# Patient Record
Sex: Female | Born: 1975 | Race: White | Hispanic: No | Marital: Married | Smoking: Never smoker
Health system: Southern US, Community
[De-identification: ages and names within clinical notes are randomized; demographics above are authoritative.]

---

## 1997-12-19 ENCOUNTER — Ambulatory Visit (HOSPITAL_COMMUNITY): Admission: RE | Admit: 1997-12-19 | Discharge: 1997-12-19 | Payer: Self-pay | Admitting: Neurological Surgery

## 2004-06-29 ENCOUNTER — Other Ambulatory Visit: Admission: RE | Admit: 2004-06-29 | Discharge: 2004-06-29 | Payer: Self-pay | Admitting: Obstetrics and Gynecology

## 2005-04-03 ENCOUNTER — Ambulatory Visit: Payer: Self-pay | Admitting: Pulmonary Disease

## 2005-04-03 ENCOUNTER — Inpatient Hospital Stay (HOSPITAL_COMMUNITY): Admission: RE | Admit: 2005-04-03 | Discharge: 2005-04-07 | Payer: Self-pay | Admitting: Neurological Surgery

## 2005-10-26 ENCOUNTER — Other Ambulatory Visit: Admission: RE | Admit: 2005-10-26 | Discharge: 2005-10-26 | Payer: Self-pay | Admitting: Obstetrics and Gynecology

## 2015-01-19 ENCOUNTER — Other Ambulatory Visit: Payer: Self-pay | Admitting: Obstetrics & Gynecology

## 2015-01-19 DIAGNOSIS — R928 Other abnormal and inconclusive findings on diagnostic imaging of breast: Secondary | ICD-10-CM

## 2015-01-21 ENCOUNTER — Other Ambulatory Visit: Payer: Self-pay | Admitting: Obstetrics & Gynecology

## 2015-01-21 DIAGNOSIS — R928 Other abnormal and inconclusive findings on diagnostic imaging of breast: Secondary | ICD-10-CM

## 2015-01-28 ENCOUNTER — Ambulatory Visit
Admission: RE | Admit: 2015-01-28 | Discharge: 2015-01-28 | Disposition: A | Discharge status: Home / Self Care | Payer: 59 | Source: Ambulatory Visit | Attending: Obstetrics & Gynecology | Admitting: Obstetrics & Gynecology

## 2015-01-28 DIAGNOSIS — R928 Other abnormal and inconclusive findings on diagnostic imaging of breast: Secondary | ICD-10-CM

## 2016-07-13 ENCOUNTER — Ambulatory Visit (INDEPENDENT_AMBULATORY_CARE_PROVIDER_SITE_OTHER): Payer: 59 | Admitting: Psychology

## 2016-07-13 DIAGNOSIS — F4323 Adjustment disorder with mixed anxiety and depressed mood: Secondary | ICD-10-CM

## 2016-08-09 ENCOUNTER — Ambulatory Visit: Payer: 59 | Admitting: Psychology

## 2016-08-11 DIAGNOSIS — Z01419 Encounter for gynecological examination (general) (routine) without abnormal findings: Secondary | ICD-10-CM | POA: Diagnosis not present

## 2016-08-11 DIAGNOSIS — Z1231 Encounter for screening mammogram for malignant neoplasm of breast: Secondary | ICD-10-CM | POA: Diagnosis not present

## 2016-08-11 DIAGNOSIS — Z124 Encounter for screening for malignant neoplasm of cervix: Secondary | ICD-10-CM | POA: Diagnosis not present

## 2016-08-21 DIAGNOSIS — S60940A Unspecified superficial injury of right index finger, initial encounter: Secondary | ICD-10-CM | POA: Diagnosis not present

## 2016-08-30 ENCOUNTER — Ambulatory Visit: Payer: 59 | Admitting: Psychology

## 2016-09-14 ENCOUNTER — Ambulatory Visit (INDEPENDENT_AMBULATORY_CARE_PROVIDER_SITE_OTHER): Payer: 59 | Admitting: Psychology

## 2016-09-14 DIAGNOSIS — F4323 Adjustment disorder with mixed anxiety and depressed mood: Secondary | ICD-10-CM

## 2016-10-05 ENCOUNTER — Ambulatory Visit (INDEPENDENT_AMBULATORY_CARE_PROVIDER_SITE_OTHER): Payer: 59 | Admitting: Psychology

## 2016-10-05 DIAGNOSIS — F4323 Adjustment disorder with mixed anxiety and depressed mood: Secondary | ICD-10-CM

## 2016-10-05 DIAGNOSIS — Z0000 Encounter for general adult medical examination without abnormal findings: Secondary | ICD-10-CM | POA: Diagnosis not present

## 2016-10-11 DIAGNOSIS — J3089 Other allergic rhinitis: Secondary | ICD-10-CM | POA: Diagnosis not present

## 2016-10-11 DIAGNOSIS — G43909 Migraine, unspecified, not intractable, without status migrainosus: Secondary | ICD-10-CM | POA: Diagnosis not present

## 2016-10-11 DIAGNOSIS — Z23 Encounter for immunization: Secondary | ICD-10-CM | POA: Diagnosis not present

## 2016-10-11 DIAGNOSIS — Z0000 Encounter for general adult medical examination without abnormal findings: Secondary | ICD-10-CM | POA: Diagnosis not present

## 2016-10-11 DIAGNOSIS — Z1389 Encounter for screening for other disorder: Secondary | ICD-10-CM | POA: Diagnosis not present

## 2016-11-01 ENCOUNTER — Ambulatory Visit (INDEPENDENT_AMBULATORY_CARE_PROVIDER_SITE_OTHER): Payer: 59 | Admitting: Psychology

## 2016-11-01 DIAGNOSIS — F4323 Adjustment disorder with mixed anxiety and depressed mood: Secondary | ICD-10-CM | POA: Diagnosis not present

## 2016-11-30 ENCOUNTER — Ambulatory Visit: Payer: 59 | Admitting: Psychology

## 2017-02-16 ENCOUNTER — Ambulatory Visit (INDEPENDENT_AMBULATORY_CARE_PROVIDER_SITE_OTHER): Payer: 59 | Admitting: Psychology

## 2017-02-16 DIAGNOSIS — F4323 Adjustment disorder with mixed anxiety and depressed mood: Secondary | ICD-10-CM

## 2017-02-22 DIAGNOSIS — Z23 Encounter for immunization: Secondary | ICD-10-CM | POA: Diagnosis not present

## 2017-04-01 DIAGNOSIS — M65331 Trigger finger, right middle finger: Secondary | ICD-10-CM | POA: Diagnosis not present

## 2017-04-03 DIAGNOSIS — M79644 Pain in right finger(s): Secondary | ICD-10-CM | POA: Diagnosis not present

## 2017-04-05 ENCOUNTER — Ambulatory Visit (INDEPENDENT_AMBULATORY_CARE_PROVIDER_SITE_OTHER): Payer: 59 | Admitting: Psychology

## 2017-04-05 DIAGNOSIS — F320 Major depressive disorder, single episode, mild: Secondary | ICD-10-CM

## 2017-05-17 ENCOUNTER — Ambulatory Visit: Payer: 59 | Admitting: Psychology

## 2017-05-25 ENCOUNTER — Ambulatory Visit (INDEPENDENT_AMBULATORY_CARE_PROVIDER_SITE_OTHER): Payer: 59 | Admitting: Psychology

## 2017-05-25 DIAGNOSIS — F322 Major depressive disorder, single episode, severe without psychotic features: Secondary | ICD-10-CM

## 2017-06-05 DIAGNOSIS — M5416 Radiculopathy, lumbar region: Secondary | ICD-10-CM | POA: Diagnosis not present

## 2017-06-07 ENCOUNTER — Other Ambulatory Visit: Payer: Self-pay | Admitting: Internal Medicine

## 2017-06-07 DIAGNOSIS — M5416 Radiculopathy, lumbar region: Secondary | ICD-10-CM

## 2017-06-09 ENCOUNTER — Ambulatory Visit
Admission: RE | Admit: 2017-06-09 | Discharge: 2017-06-09 | Disposition: A | Discharge status: Home / Self Care | Payer: 59 | Source: Ambulatory Visit | Attending: Internal Medicine | Admitting: Internal Medicine

## 2017-06-09 DIAGNOSIS — M5416 Radiculopathy, lumbar region: Secondary | ICD-10-CM

## 2017-06-09 DIAGNOSIS — M48061 Spinal stenosis, lumbar region without neurogenic claudication: Secondary | ICD-10-CM | POA: Diagnosis not present

## 2017-06-18 DIAGNOSIS — M79644 Pain in right finger(s): Secondary | ICD-10-CM | POA: Diagnosis not present

## 2017-06-21 DIAGNOSIS — M5416 Radiculopathy, lumbar region: Secondary | ICD-10-CM | POA: Diagnosis not present

## 2017-06-21 DIAGNOSIS — M5126 Other intervertebral disc displacement, lumbar region: Secondary | ICD-10-CM | POA: Diagnosis not present

## 2017-06-29 DIAGNOSIS — M5136 Other intervertebral disc degeneration, lumbar region: Secondary | ICD-10-CM | POA: Diagnosis not present

## 2017-06-29 DIAGNOSIS — M5116 Intervertebral disc disorders with radiculopathy, lumbar region: Secondary | ICD-10-CM | POA: Diagnosis not present

## 2017-06-29 DIAGNOSIS — M4726 Other spondylosis with radiculopathy, lumbar region: Secondary | ICD-10-CM | POA: Diagnosis not present

## 2017-06-29 DIAGNOSIS — M48061 Spinal stenosis, lumbar region without neurogenic claudication: Secondary | ICD-10-CM | POA: Diagnosis not present

## 2017-07-09 DIAGNOSIS — Z79899 Other long term (current) drug therapy: Secondary | ICD-10-CM | POA: Diagnosis not present

## 2017-07-09 DIAGNOSIS — M65331 Trigger finger, right middle finger: Secondary | ICD-10-CM | POA: Diagnosis not present

## 2017-07-16 ENCOUNTER — Ambulatory Visit (INDEPENDENT_AMBULATORY_CARE_PROVIDER_SITE_OTHER): Payer: 59 | Admitting: Psychology

## 2017-07-16 DIAGNOSIS — M79644 Pain in right finger(s): Secondary | ICD-10-CM | POA: Diagnosis not present

## 2017-07-16 DIAGNOSIS — F322 Major depressive disorder, single episode, severe without psychotic features: Secondary | ICD-10-CM | POA: Diagnosis not present

## 2017-07-16 DIAGNOSIS — M25641 Stiffness of right hand, not elsewhere classified: Secondary | ICD-10-CM | POA: Diagnosis not present

## 2017-07-16 DIAGNOSIS — R600 Localized edema: Secondary | ICD-10-CM | POA: Diagnosis not present

## 2017-07-23 DIAGNOSIS — G478 Other sleep disorders: Secondary | ICD-10-CM | POA: Diagnosis not present

## 2017-08-08 DIAGNOSIS — M79644 Pain in right finger(s): Secondary | ICD-10-CM | POA: Diagnosis not present

## 2017-08-08 DIAGNOSIS — R29898 Other symptoms and signs involving the musculoskeletal system: Secondary | ICD-10-CM | POA: Diagnosis not present

## 2017-08-08 DIAGNOSIS — R600 Localized edema: Secondary | ICD-10-CM | POA: Diagnosis not present

## 2017-08-22 DIAGNOSIS — R29898 Other symptoms and signs involving the musculoskeletal system: Secondary | ICD-10-CM | POA: Diagnosis not present

## 2017-08-22 DIAGNOSIS — M25641 Stiffness of right hand, not elsewhere classified: Secondary | ICD-10-CM | POA: Diagnosis not present

## 2017-08-22 DIAGNOSIS — M79644 Pain in right finger(s): Secondary | ICD-10-CM | POA: Diagnosis not present

## 2017-08-23 ENCOUNTER — Ambulatory Visit (INDEPENDENT_AMBULATORY_CARE_PROVIDER_SITE_OTHER): Payer: 59 | Admitting: Psychology

## 2017-08-23 DIAGNOSIS — F322 Major depressive disorder, single episode, severe without psychotic features: Secondary | ICD-10-CM | POA: Diagnosis not present

## 2017-09-03 DIAGNOSIS — M79644 Pain in right finger(s): Secondary | ICD-10-CM | POA: Diagnosis not present

## 2017-09-03 DIAGNOSIS — M25641 Stiffness of right hand, not elsewhere classified: Secondary | ICD-10-CM | POA: Diagnosis not present

## 2017-09-03 DIAGNOSIS — R29898 Other symptoms and signs involving the musculoskeletal system: Secondary | ICD-10-CM | POA: Diagnosis not present

## 2017-10-11 ENCOUNTER — Ambulatory Visit (INDEPENDENT_AMBULATORY_CARE_PROVIDER_SITE_OTHER): Payer: 59 | Admitting: Psychology

## 2017-10-11 DIAGNOSIS — F322 Major depressive disorder, single episode, severe without psychotic features: Secondary | ICD-10-CM | POA: Diagnosis not present

## 2017-10-23 DIAGNOSIS — G4700 Insomnia, unspecified: Secondary | ICD-10-CM | POA: Diagnosis not present

## 2017-11-14 ENCOUNTER — Ambulatory Visit (INDEPENDENT_AMBULATORY_CARE_PROVIDER_SITE_OTHER): Payer: 59 | Admitting: Psychology

## 2017-11-14 DIAGNOSIS — F322 Major depressive disorder, single episode, severe without psychotic features: Secondary | ICD-10-CM | POA: Diagnosis not present

## 2017-11-19 DIAGNOSIS — G478 Other sleep disorders: Secondary | ICD-10-CM | POA: Diagnosis not present

## 2018-01-03 ENCOUNTER — Ambulatory Visit (INDEPENDENT_AMBULATORY_CARE_PROVIDER_SITE_OTHER): Payer: 59 | Admitting: Psychology

## 2018-01-03 DIAGNOSIS — F322 Major depressive disorder, single episode, severe without psychotic features: Secondary | ICD-10-CM | POA: Diagnosis not present

## 2018-01-24 DIAGNOSIS — G4700 Insomnia, unspecified: Secondary | ICD-10-CM | POA: Diagnosis not present

## 2018-02-01 ENCOUNTER — Ambulatory Visit (INDEPENDENT_AMBULATORY_CARE_PROVIDER_SITE_OTHER): Payer: 59 | Admitting: Psychology

## 2018-02-01 DIAGNOSIS — F322 Major depressive disorder, single episode, severe without psychotic features: Secondary | ICD-10-CM | POA: Diagnosis not present

## 2018-02-22 DIAGNOSIS — Z23 Encounter for immunization: Secondary | ICD-10-CM | POA: Diagnosis not present

## 2018-03-29 ENCOUNTER — Ambulatory Visit (INDEPENDENT_AMBULATORY_CARE_PROVIDER_SITE_OTHER): Payer: 59 | Admitting: Psychology

## 2018-03-29 DIAGNOSIS — F322 Major depressive disorder, single episode, severe without psychotic features: Secondary | ICD-10-CM | POA: Diagnosis not present

## 2018-04-01 DIAGNOSIS — Z0000 Encounter for general adult medical examination without abnormal findings: Secondary | ICD-10-CM | POA: Diagnosis not present

## 2018-04-01 DIAGNOSIS — R82998 Other abnormal findings in urine: Secondary | ICD-10-CM | POA: Diagnosis not present

## 2018-04-02 DIAGNOSIS — G4700 Insomnia, unspecified: Secondary | ICD-10-CM | POA: Diagnosis not present

## 2018-04-08 DIAGNOSIS — Z0000 Encounter for general adult medical examination without abnormal findings: Secondary | ICD-10-CM | POA: Diagnosis not present

## 2018-04-08 DIAGNOSIS — Z1389 Encounter for screening for other disorder: Secondary | ICD-10-CM | POA: Diagnosis not present

## 2018-04-08 DIAGNOSIS — M5416 Radiculopathy, lumbar region: Secondary | ICD-10-CM | POA: Diagnosis not present

## 2018-04-08 DIAGNOSIS — G43909 Migraine, unspecified, not intractable, without status migrainosus: Secondary | ICD-10-CM | POA: Diagnosis not present

## 2018-04-15 DIAGNOSIS — B349 Viral infection, unspecified: Secondary | ICD-10-CM | POA: Diagnosis not present

## 2018-04-15 DIAGNOSIS — J029 Acute pharyngitis, unspecified: Secondary | ICD-10-CM | POA: Diagnosis not present

## 2018-05-24 ENCOUNTER — Ambulatory Visit: Payer: Self-pay | Admitting: Psychology

## 2018-06-28 ENCOUNTER — Ambulatory Visit (INDEPENDENT_AMBULATORY_CARE_PROVIDER_SITE_OTHER): Payer: BLUE CROSS/BLUE SHIELD | Admitting: Psychology

## 2018-06-28 DIAGNOSIS — F322 Major depressive disorder, single episode, severe without psychotic features: Secondary | ICD-10-CM

## 2018-08-06 ENCOUNTER — Ambulatory Visit: Payer: BLUE CROSS/BLUE SHIELD | Admitting: Psychology

## 2018-09-17 IMAGING — MR MR LUMBAR SPINE W/O CM
5 series · 47 of 48 positions shown · non-contrast
Comparison: None.

CLINICAL DATA: Low back pain radiating to left leg

EXAM:
MRI LUMBAR SPINE WITHOUT CONTRAST
TECHNIQUE: Multiplanar, multisequence MR imaging of the lumbar spine was
performed. No intravenous contrast was administered.

[Series 3: T1 · sagittal · 4.0mm · 0.88mm/px · 6 of 15 slices shown (1 of 2)]
[im 1/15]
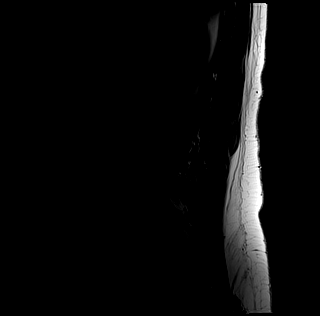
[im 3/15]
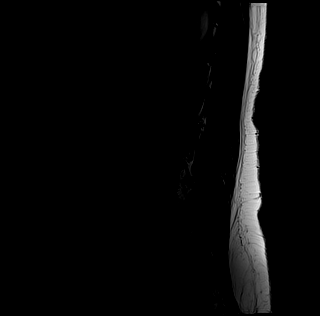
[im 6/15]
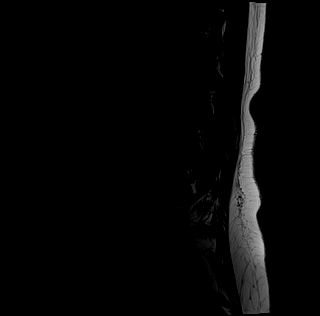
[im 9/15]
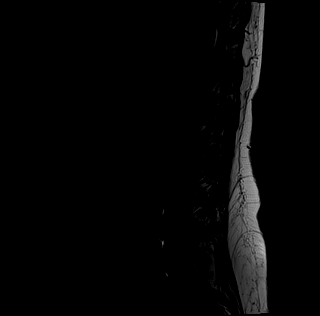
[im 12/15]
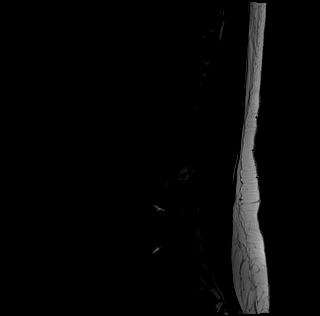
[im 15/15]
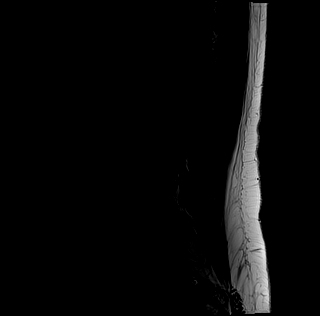

[Series 4: T2 · sagittal · 4.0mm · 0.88mm/px · 5 of 15 slices shown (1 of 2)]
[im 1/15]
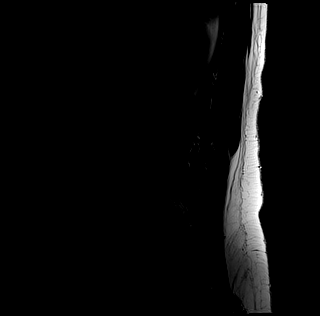
[im 4/15]
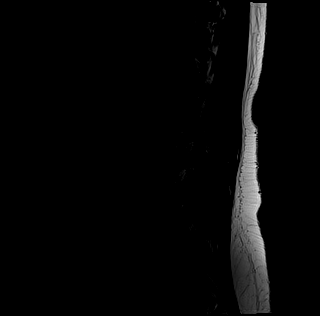
[im 8/15]
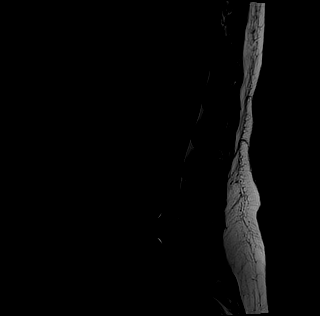
[im 11/15]
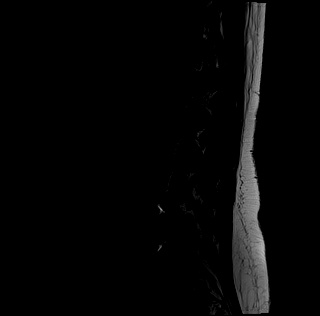
[im 15/15]
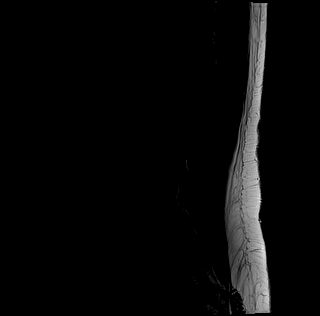

[Series 5: tirm sag · sagittal · 4.0mm · 0.55mm/px · 5 of 15 slices shown]
[im 1/15]
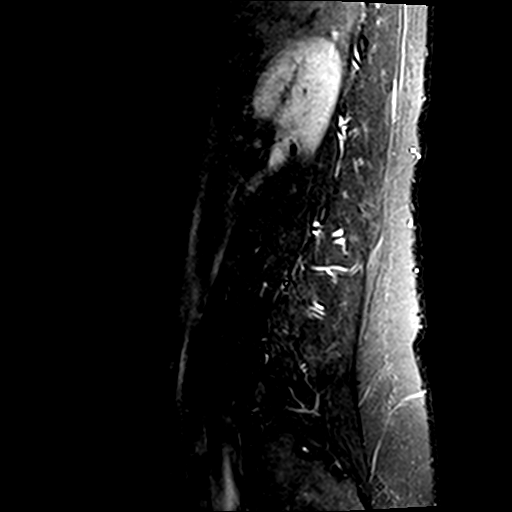
[im 4/15]
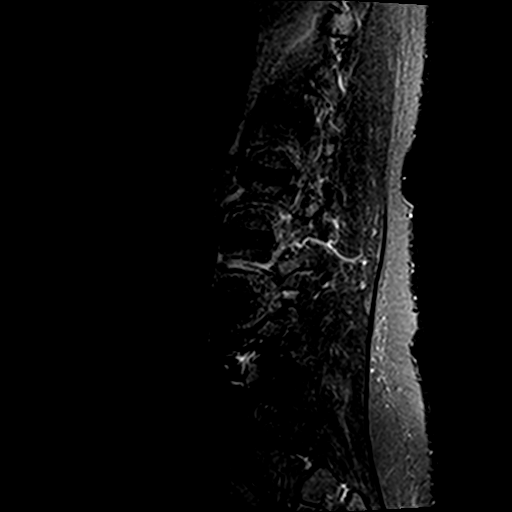
[im 8/15]
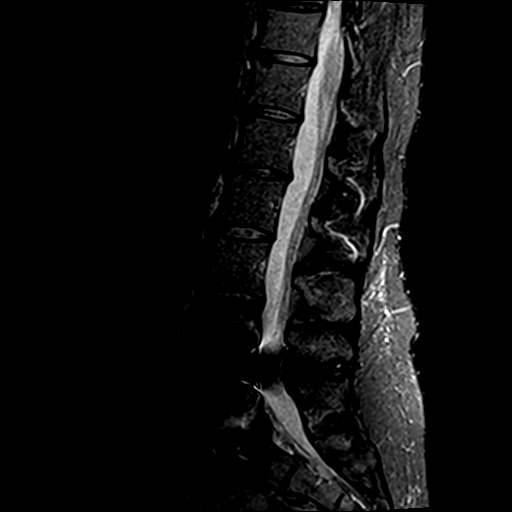
[im 11/15]
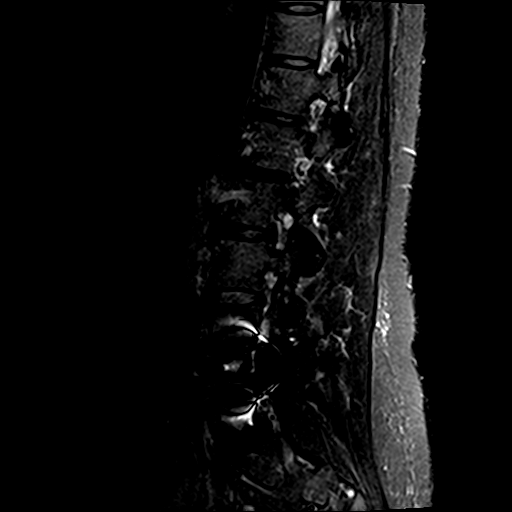
[im 15/15]
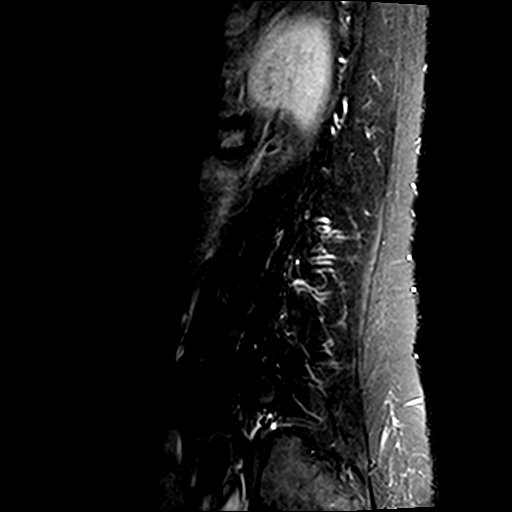

[Series 6: T1 · axial · 4.0mm · 0.70mm/px · z∈[-111,+101]mm · 15 of 43 slices shown (2 of 2)]
[im 1/43]
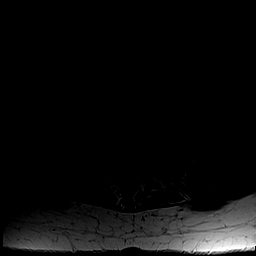
[im 3/43]
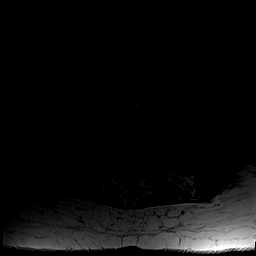
[im 6/43]
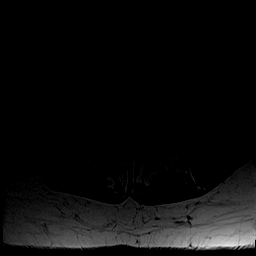
[im 9/43]
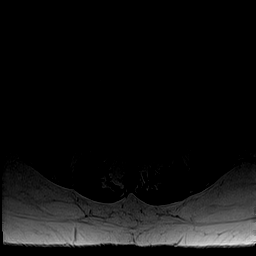
[im 12/43]
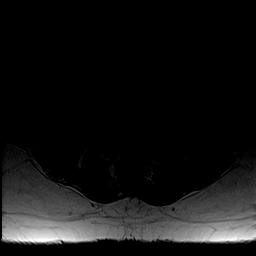
[im 15/43]
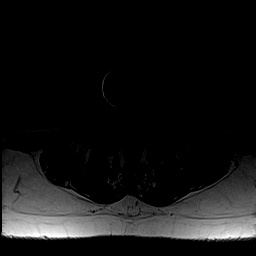
[im 17/43]
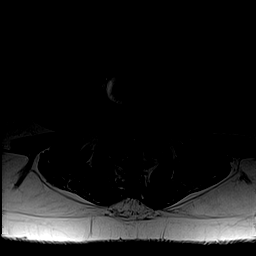
[im 20/43]
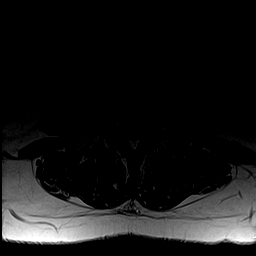
[im 23/43]
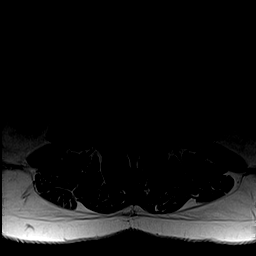
[im 26/43]
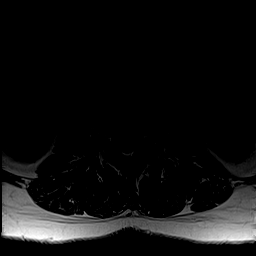
[im 29/43]
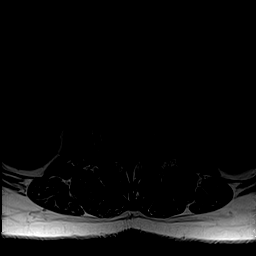
[im 31/43]
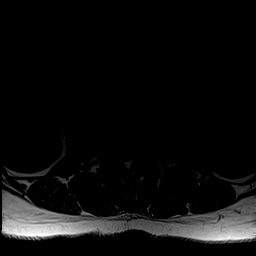
[im 34/43]
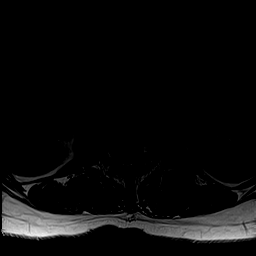
[im 37/43]
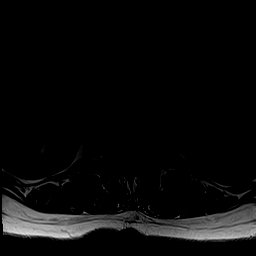
[im 43/43]
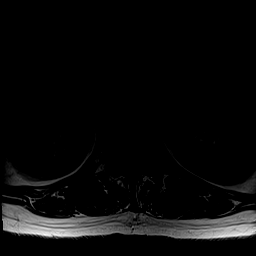

[Series 7: T2 · axial · 4.0mm · 0.70mm/px · z∈[-111,+101]mm · 16 of 43 slices shown (2 of 2)]
[im 1/43]
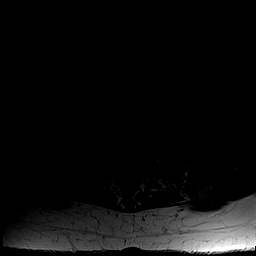
[im 3/43]
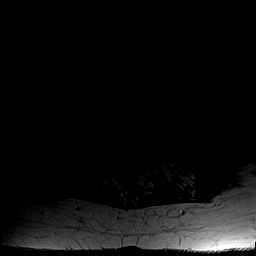
[im 6/43]
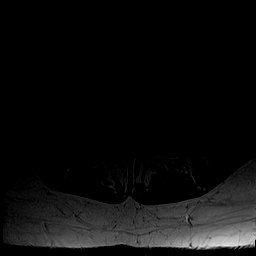
[im 9/43]
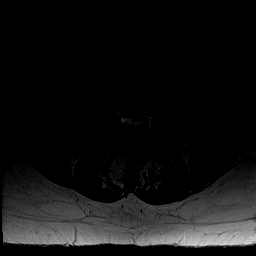
[im 12/43]
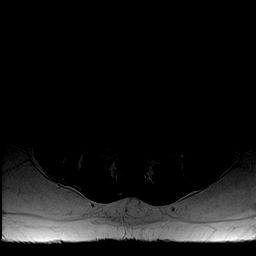
[im 15/43]
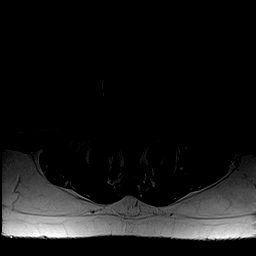
[im 17/43]
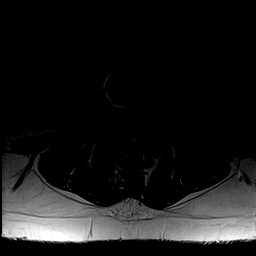
[im 20/43]
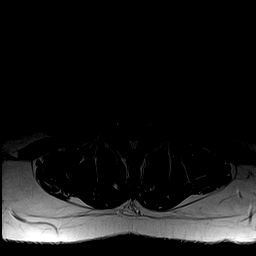
[im 23/43]
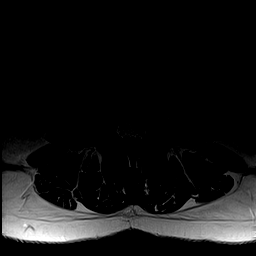
[im 26/43]
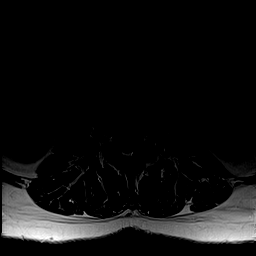
[im 29/43]
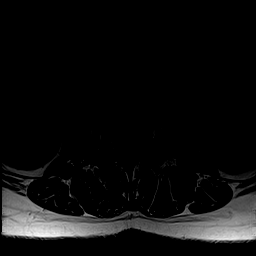
[im 31/43]
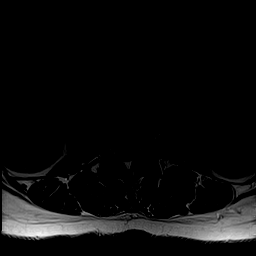
[im 34/43]
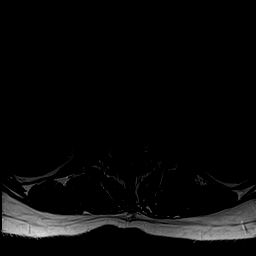
[im 37/43]
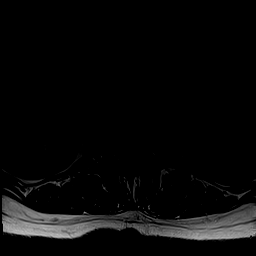
[im 40/43]
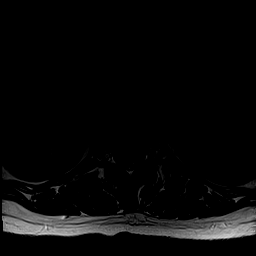
[im 43/43]
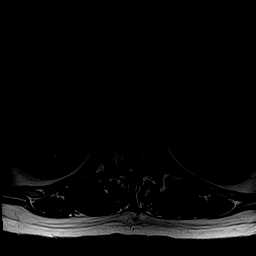

[47 of 48 positions shown; findings below may reference images not displayed]

FINDINGS: Segmentation:  Standard.

Alignment:  Physiologic.

Vertebrae: Artificial disc at L4-L5. L5-S1 fusion. No acute marrow
abnormality.

Conus medullaris and cauda equina: Conus extends to the T12-L1
level. Conus and cauda equina appear normal.

Paraspinal and other soft tissues: Negative

Disc levels:

Except as described below the intervertebral disc spaces are normal.

L2-L3: Left foraminal disc protrusion with annular fissure in close
proximity to the exiting left L2 nerve root. No spinal canal
stenosis.

L4-L5: Artificial disc. Moderate right and severe left facet
hypertrophy. No spinal canal stenosis or nerve root impingement.

L5-S1: Postfusion changes. Left subarticular disc osteophyte complex
mildly narrowing the left lateral recess.
IMPRESSION: 1. L2-L3 left foraminal disc protrusion and annular fissure, which
may be a source of irritation to the left L2 nerve root.
2. Postsurgical changes at L4-L5 and L5-S1. Mild narrowing of the
left lateral recess at L5-S1 secondary to left subarticular disc
osteophyte complex. Correlate for left S1 radiculopathy.

## 2019-01-01 DIAGNOSIS — F411 Generalized anxiety disorder: Secondary | ICD-10-CM | POA: Diagnosis not present

## 2019-01-01 DIAGNOSIS — F331 Major depressive disorder, recurrent, moderate: Secondary | ICD-10-CM | POA: Diagnosis not present

## 2019-02-18 DIAGNOSIS — Z01419 Encounter for gynecological examination (general) (routine) without abnormal findings: Secondary | ICD-10-CM | POA: Diagnosis not present

## 2019-02-18 DIAGNOSIS — Z1231 Encounter for screening mammogram for malignant neoplasm of breast: Secondary | ICD-10-CM | POA: Diagnosis not present

## 2019-02-18 DIAGNOSIS — Z124 Encounter for screening for malignant neoplasm of cervix: Secondary | ICD-10-CM | POA: Diagnosis not present

## 2019-04-07 DIAGNOSIS — Z0000 Encounter for general adult medical examination without abnormal findings: Secondary | ICD-10-CM | POA: Diagnosis not present

## 2019-04-07 DIAGNOSIS — R7989 Other specified abnormal findings of blood chemistry: Secondary | ICD-10-CM | POA: Diagnosis not present

## 2019-04-09 DIAGNOSIS — F331 Major depressive disorder, recurrent, moderate: Secondary | ICD-10-CM | POA: Diagnosis not present

## 2019-04-09 DIAGNOSIS — R82998 Other abnormal findings in urine: Secondary | ICD-10-CM | POA: Diagnosis not present

## 2019-04-14 DIAGNOSIS — K219 Gastro-esophageal reflux disease without esophagitis: Secondary | ICD-10-CM | POA: Insufficient documentation

## 2019-04-14 DIAGNOSIS — J309 Allergic rhinitis, unspecified: Secondary | ICD-10-CM | POA: Diagnosis not present

## 2019-04-14 DIAGNOSIS — G43909 Migraine, unspecified, not intractable, without status migrainosus: Secondary | ICD-10-CM | POA: Diagnosis not present

## 2019-04-14 DIAGNOSIS — M5416 Radiculopathy, lumbar region: Secondary | ICD-10-CM | POA: Diagnosis not present

## 2019-04-14 DIAGNOSIS — Z1331 Encounter for screening for depression: Secondary | ICD-10-CM | POA: Diagnosis not present

## 2019-04-14 DIAGNOSIS — Z0000 Encounter for general adult medical examination without abnormal findings: Secondary | ICD-10-CM | POA: Diagnosis not present

## 2019-05-13 DIAGNOSIS — K921 Melena: Secondary | ICD-10-CM | POA: Diagnosis not present

## 2019-06-03 DIAGNOSIS — M79644 Pain in right finger(s): Secondary | ICD-10-CM | POA: Diagnosis not present

## 2019-07-09 DIAGNOSIS — F331 Major depressive disorder, recurrent, moderate: Secondary | ICD-10-CM | POA: Diagnosis not present

## 2019-07-09 DIAGNOSIS — F411 Generalized anxiety disorder: Secondary | ICD-10-CM | POA: Diagnosis not present

## 2019-07-29 DIAGNOSIS — Z1212 Encounter for screening for malignant neoplasm of rectum: Secondary | ICD-10-CM | POA: Diagnosis not present

## 2019-07-29 DIAGNOSIS — M79644 Pain in right finger(s): Secondary | ICD-10-CM | POA: Diagnosis not present

## 2019-07-31 ENCOUNTER — Ambulatory Visit: Payer: BC Managed Care – PPO | Attending: Internal Medicine

## 2019-07-31 DIAGNOSIS — M79644 Pain in right finger(s): Secondary | ICD-10-CM | POA: Diagnosis not present

## 2019-07-31 DIAGNOSIS — Z23 Encounter for immunization: Secondary | ICD-10-CM

## 2019-07-31 DIAGNOSIS — M1811 Unilateral primary osteoarthritis of first carpometacarpal joint, right hand: Secondary | ICD-10-CM | POA: Diagnosis not present

## 2019-07-31 DIAGNOSIS — M654 Radial styloid tenosynovitis [de Quervain]: Secondary | ICD-10-CM | POA: Diagnosis not present

## 2019-07-31 NOTE — Progress Notes (Signed)
   Covid-19 Vaccination Clinic  Name:  RENELDA KILIAN    MRN: [id number] DOB: 1976/01/14  07/31/2019  [name] was observed post Covid-19 immunization for 15 minutes without incident. She was provided with Vaccine Information Sheet and instruction to access the V-Safe system.   [name] was instructed to call 911 with any severe reactions post vaccine: Marland Kitchen Difficulty breathing  . Swelling of face and throat  . A fast heartbeat  . A bad rash all over body  . Dizziness and weakness   Immunizations Administered    Name Date Dose VIS Date Route   Pfizer COVID-19 Vaccine 07/31/2019  3:58 PM 0.3 mL 05/02/2019 Intramuscular   Manufacturer: [employer], [employer]   Lot: [id number]   NDC: [address]-1

## 2019-09-02 DIAGNOSIS — L308 Other specified dermatitis: Secondary | ICD-10-CM | POA: Diagnosis not present

## 2019-09-02 DIAGNOSIS — L738 Other specified follicular disorders: Secondary | ICD-10-CM | POA: Diagnosis not present

## 2019-09-02 DIAGNOSIS — D225 Melanocytic nevi of trunk: Secondary | ICD-10-CM | POA: Diagnosis not present

## 2019-09-02 DIAGNOSIS — L564 Polymorphous light eruption: Secondary | ICD-10-CM | POA: Diagnosis not present

## 2019-09-30 DIAGNOSIS — M79644 Pain in right finger(s): Secondary | ICD-10-CM | POA: Diagnosis not present

## 2019-11-05 DIAGNOSIS — F331 Major depressive disorder, recurrent, moderate: Secondary | ICD-10-CM | POA: Diagnosis not present

## 2019-11-05 DIAGNOSIS — F411 Generalized anxiety disorder: Secondary | ICD-10-CM | POA: Diagnosis not present

## 2019-11-13 DIAGNOSIS — M79644 Pain in right finger(s): Secondary | ICD-10-CM | POA: Diagnosis not present

## 2019-11-13 DIAGNOSIS — M1811 Unilateral primary osteoarthritis of first carpometacarpal joint, right hand: Secondary | ICD-10-CM | POA: Diagnosis not present

## 2019-11-28 ENCOUNTER — Ambulatory Visit (INDEPENDENT_AMBULATORY_CARE_PROVIDER_SITE_OTHER): Payer: BC Managed Care – PPO | Admitting: Psychology

## 2019-11-28 DIAGNOSIS — F320 Major depressive disorder, single episode, mild: Secondary | ICD-10-CM

## 2019-12-25 ENCOUNTER — Ambulatory Visit (INDEPENDENT_AMBULATORY_CARE_PROVIDER_SITE_OTHER): Payer: BC Managed Care – PPO | Admitting: Psychology

## 2019-12-25 DIAGNOSIS — F320 Major depressive disorder, single episode, mild: Secondary | ICD-10-CM | POA: Diagnosis not present

## 2019-12-29 ENCOUNTER — Ambulatory Visit: Payer: BC Managed Care – PPO | Admitting: Psychology

## 2020-01-20 DIAGNOSIS — M1811 Unilateral primary osteoarthritis of first carpometacarpal joint, right hand: Secondary | ICD-10-CM | POA: Diagnosis not present

## 2020-02-06 DIAGNOSIS — Z9889 Other specified postprocedural states: Secondary | ICD-10-CM | POA: Diagnosis not present

## 2020-02-06 DIAGNOSIS — M1811 Unilateral primary osteoarthritis of first carpometacarpal joint, right hand: Secondary | ICD-10-CM | POA: Diagnosis not present

## 2020-02-06 DIAGNOSIS — M19031 Primary osteoarthritis, right wrist: Secondary | ICD-10-CM | POA: Diagnosis not present

## 2020-02-10 DIAGNOSIS — F331 Major depressive disorder, recurrent, moderate: Secondary | ICD-10-CM | POA: Diagnosis not present

## 2020-02-10 DIAGNOSIS — F411 Generalized anxiety disorder: Secondary | ICD-10-CM | POA: Diagnosis not present

## 2020-02-12 DIAGNOSIS — M79644 Pain in right finger(s): Secondary | ICD-10-CM | POA: Diagnosis not present

## 2020-02-12 DIAGNOSIS — M1811 Unilateral primary osteoarthritis of first carpometacarpal joint, right hand: Secondary | ICD-10-CM | POA: Diagnosis not present

## 2020-02-19 DIAGNOSIS — M79644 Pain in right finger(s): Secondary | ICD-10-CM | POA: Diagnosis not present

## 2020-03-04 DIAGNOSIS — M79644 Pain in right finger(s): Secondary | ICD-10-CM | POA: Diagnosis not present

## 2020-03-18 DIAGNOSIS — M79644 Pain in right finger(s): Secondary | ICD-10-CM | POA: Diagnosis not present

## 2020-04-01 DIAGNOSIS — M79644 Pain in right finger(s): Secondary | ICD-10-CM | POA: Diagnosis not present

## 2020-04-13 DIAGNOSIS — M79644 Pain in right finger(s): Secondary | ICD-10-CM | POA: Diagnosis not present

## 2020-04-20 DIAGNOSIS — L301 Dyshidrosis [pompholyx]: Secondary | ICD-10-CM | POA: Diagnosis not present

## 2020-05-04 DIAGNOSIS — F411 Generalized anxiety disorder: Secondary | ICD-10-CM | POA: Diagnosis not present

## 2020-05-04 DIAGNOSIS — F331 Major depressive disorder, recurrent, moderate: Secondary | ICD-10-CM | POA: Diagnosis not present

## 2020-05-13 DIAGNOSIS — G8918 Other acute postprocedural pain: Secondary | ICD-10-CM | POA: Diagnosis not present

## 2020-05-13 DIAGNOSIS — M75121 Complete rotator cuff tear or rupture of right shoulder, not specified as traumatic: Secondary | ICD-10-CM | POA: Diagnosis not present

## 2020-05-13 DIAGNOSIS — S43431A Superior glenoid labrum lesion of right shoulder, initial encounter: Secondary | ICD-10-CM | POA: Diagnosis not present

## 2020-05-13 DIAGNOSIS — M7541 Impingement syndrome of right shoulder: Secondary | ICD-10-CM | POA: Diagnosis not present

## 2020-05-13 DIAGNOSIS — S46011A Strain of muscle(s) and tendon(s) of the rotator cuff of right shoulder, initial encounter: Secondary | ICD-10-CM | POA: Diagnosis not present

## 2020-05-13 DIAGNOSIS — M25511 Pain in right shoulder: Secondary | ICD-10-CM | POA: Diagnosis not present

## 2020-06-21 ENCOUNTER — Ambulatory Visit (INDEPENDENT_AMBULATORY_CARE_PROVIDER_SITE_OTHER): Payer: BC Managed Care – PPO | Admitting: Psychology

## 2020-06-21 DIAGNOSIS — F320 Major depressive disorder, single episode, mild: Secondary | ICD-10-CM

## 2020-06-24 DIAGNOSIS — Z0000 Encounter for general adult medical examination without abnormal findings: Secondary | ICD-10-CM | POA: Diagnosis not present

## 2020-06-30 DIAGNOSIS — Z0000 Encounter for general adult medical examination without abnormal findings: Secondary | ICD-10-CM | POA: Diagnosis not present

## 2020-06-30 DIAGNOSIS — Z1331 Encounter for screening for depression: Secondary | ICD-10-CM | POA: Diagnosis not present

## 2020-06-30 DIAGNOSIS — Z1339 Encounter for screening examination for other mental health and behavioral disorders: Secondary | ICD-10-CM | POA: Diagnosis not present

## 2020-06-30 DIAGNOSIS — Z1212 Encounter for screening for malignant neoplasm of rectum: Secondary | ICD-10-CM | POA: Diagnosis not present

## 2020-08-03 DIAGNOSIS — F331 Major depressive disorder, recurrent, moderate: Secondary | ICD-10-CM | POA: Diagnosis not present

## 2020-08-03 DIAGNOSIS — F411 Generalized anxiety disorder: Secondary | ICD-10-CM | POA: Diagnosis not present

## 2020-09-14 DIAGNOSIS — Z124 Encounter for screening for malignant neoplasm of cervix: Secondary | ICD-10-CM | POA: Diagnosis not present

## 2020-09-14 DIAGNOSIS — Z01419 Encounter for gynecological examination (general) (routine) without abnormal findings: Secondary | ICD-10-CM | POA: Diagnosis not present

## 2020-09-14 DIAGNOSIS — Z1231 Encounter for screening mammogram for malignant neoplasm of breast: Secondary | ICD-10-CM | POA: Diagnosis not present

## 2020-09-22 DIAGNOSIS — H53143 Visual discomfort, bilateral: Secondary | ICD-10-CM | POA: Diagnosis not present

## 2020-10-14 ENCOUNTER — Ambulatory Visit (INDEPENDENT_AMBULATORY_CARE_PROVIDER_SITE_OTHER): Payer: BC Managed Care – PPO | Admitting: Psychology

## 2020-10-14 DIAGNOSIS — F330 Major depressive disorder, recurrent, mild: Secondary | ICD-10-CM | POA: Diagnosis not present

## 2020-11-02 DIAGNOSIS — F411 Generalized anxiety disorder: Secondary | ICD-10-CM | POA: Diagnosis not present

## 2020-11-02 DIAGNOSIS — F331 Major depressive disorder, recurrent, moderate: Secondary | ICD-10-CM | POA: Diagnosis not present

## 2020-11-08 DIAGNOSIS — L720 Epidermal cyst: Secondary | ICD-10-CM | POA: Diagnosis not present

## 2020-11-25 ENCOUNTER — Ambulatory Visit: Payer: BC Managed Care – PPO | Admitting: Psychology

## 2021-02-09 DIAGNOSIS — F331 Major depressive disorder, recurrent, moderate: Secondary | ICD-10-CM | POA: Diagnosis not present

## 2021-02-09 DIAGNOSIS — F411 Generalized anxiety disorder: Secondary | ICD-10-CM | POA: Diagnosis not present

## 2021-05-24 DIAGNOSIS — F411 Generalized anxiety disorder: Secondary | ICD-10-CM | POA: Diagnosis not present

## 2021-05-24 DIAGNOSIS — F331 Major depressive disorder, recurrent, moderate: Secondary | ICD-10-CM | POA: Diagnosis not present

## 2021-07-06 DIAGNOSIS — Z79899 Other long term (current) drug therapy: Secondary | ICD-10-CM | POA: Diagnosis not present

## 2021-07-22 DIAGNOSIS — Z0000 Encounter for general adult medical examination without abnormal findings: Secondary | ICD-10-CM | POA: Diagnosis not present

## 2021-07-22 DIAGNOSIS — Z1331 Encounter for screening for depression: Secondary | ICD-10-CM | POA: Diagnosis not present

## 2021-07-22 DIAGNOSIS — Z1339 Encounter for screening examination for other mental health and behavioral disorders: Secondary | ICD-10-CM | POA: Diagnosis not present

## 2021-07-22 DIAGNOSIS — G43909 Migraine, unspecified, not intractable, without status migrainosus: Secondary | ICD-10-CM | POA: Diagnosis not present

## 2021-08-23 DIAGNOSIS — F411 Generalized anxiety disorder: Secondary | ICD-10-CM | POA: Diagnosis not present

## 2021-08-23 DIAGNOSIS — F331 Major depressive disorder, recurrent, moderate: Secondary | ICD-10-CM | POA: Diagnosis not present

## 2021-11-23 DIAGNOSIS — F331 Major depressive disorder, recurrent, moderate: Secondary | ICD-10-CM | POA: Diagnosis not present

## 2021-11-23 DIAGNOSIS — F411 Generalized anxiety disorder: Secondary | ICD-10-CM | POA: Diagnosis not present

## 2022-02-03 DIAGNOSIS — M7062 Trochanteric bursitis, left hip: Secondary | ICD-10-CM | POA: Diagnosis not present

## 2022-02-22 DIAGNOSIS — F411 Generalized anxiety disorder: Secondary | ICD-10-CM | POA: Diagnosis not present

## 2022-02-22 DIAGNOSIS — F331 Major depressive disorder, recurrent, moderate: Secondary | ICD-10-CM | POA: Diagnosis not present

## 2022-04-04 DIAGNOSIS — Z1231 Encounter for screening mammogram for malignant neoplasm of breast: Secondary | ICD-10-CM | POA: Diagnosis not present

## 2022-04-04 DIAGNOSIS — Z6831 Body mass index (BMI) 31.0-31.9, adult: Secondary | ICD-10-CM | POA: Diagnosis not present

## 2022-04-04 DIAGNOSIS — R32 Unspecified urinary incontinence: Secondary | ICD-10-CM | POA: Diagnosis not present

## 2022-04-04 DIAGNOSIS — Z124 Encounter for screening for malignant neoplasm of cervix: Secondary | ICD-10-CM | POA: Diagnosis not present

## 2022-04-04 DIAGNOSIS — Z01419 Encounter for gynecological examination (general) (routine) without abnormal findings: Secondary | ICD-10-CM | POA: Diagnosis not present

## 2022-04-04 DIAGNOSIS — N939 Abnormal uterine and vaginal bleeding, unspecified: Secondary | ICD-10-CM | POA: Diagnosis not present

## 2022-04-04 LAB — CBC AND DIFFERENTIAL
HCT: 41 (ref 36–46)
Hemoglobin: 14.2 (ref 12.0–16.0)
Platelets: 287 10*3/uL (ref 150–400)
WBC: 6.9

## 2022-04-04 LAB — CBC: RBC: 4.45 (ref 3.87–5.11)

## 2022-04-04 LAB — TSH: TSH: 2.57 (ref 0.41–5.90)

## 2022-05-09 DIAGNOSIS — R399 Unspecified symptoms and signs involving the genitourinary system: Secondary | ICD-10-CM | POA: Diagnosis not present

## 2022-05-09 DIAGNOSIS — D259 Leiomyoma of uterus, unspecified: Secondary | ICD-10-CM | POA: Diagnosis not present

## 2022-05-09 DIAGNOSIS — D251 Intramural leiomyoma of uterus: Secondary | ICD-10-CM | POA: Diagnosis not present

## 2022-05-09 DIAGNOSIS — N8003 Adenomyosis of the uterus: Secondary | ICD-10-CM | POA: Diagnosis not present

## 2022-05-09 DIAGNOSIS — N925 Other specified irregular menstruation: Secondary | ICD-10-CM | POA: Diagnosis not present

## 2022-05-09 DIAGNOSIS — N939 Abnormal uterine and vaginal bleeding, unspecified: Secondary | ICD-10-CM | POA: Diagnosis not present

## 2022-05-23 DIAGNOSIS — N939 Abnormal uterine and vaginal bleeding, unspecified: Secondary | ICD-10-CM | POA: Diagnosis not present

## 2022-05-23 DIAGNOSIS — N390 Urinary tract infection, site not specified: Secondary | ICD-10-CM | POA: Diagnosis not present

## 2022-05-24 DIAGNOSIS — F411 Generalized anxiety disorder: Secondary | ICD-10-CM | POA: Diagnosis not present

## 2022-05-24 DIAGNOSIS — F331 Major depressive disorder, recurrent, moderate: Secondary | ICD-10-CM | POA: Diagnosis not present

## 2022-05-29 DIAGNOSIS — F4323 Adjustment disorder with mixed anxiety and depressed mood: Secondary | ICD-10-CM | POA: Diagnosis not present

## 2022-06-06 ENCOUNTER — Ambulatory Visit (INDEPENDENT_AMBULATORY_CARE_PROVIDER_SITE_OTHER): Payer: BC Managed Care – PPO | Admitting: Bariatrics

## 2022-06-06 ENCOUNTER — Encounter: Payer: Self-pay | Admitting: Bariatrics

## 2022-06-06 VITALS — BP 129/88 | HR 78 | Temp 98.0°F | Ht 68.0 in | Wt 229.0 lb

## 2022-06-06 DIAGNOSIS — R5383 Other fatigue: Secondary | ICD-10-CM

## 2022-06-06 DIAGNOSIS — E669 Obesity, unspecified: Secondary | ICD-10-CM

## 2022-06-06 DIAGNOSIS — Z833 Family history of diabetes mellitus: Secondary | ICD-10-CM

## 2022-06-06 DIAGNOSIS — Z6834 Body mass index (BMI) 34.0-34.9, adult: Secondary | ICD-10-CM

## 2022-06-06 DIAGNOSIS — E7800 Pure hypercholesterolemia, unspecified: Secondary | ICD-10-CM

## 2022-06-12 DIAGNOSIS — F4323 Adjustment disorder with mixed anxiety and depressed mood: Secondary | ICD-10-CM | POA: Diagnosis not present

## 2022-06-14 NOTE — Progress Notes (Unsigned)
Office: [phone]  /  Fax: [phone]   Initial Visit  Kylie Dickerson was seen in clinic today to evaluate for obesity. Kylie Dickerson is interested in losing weight to improve overall health and reduce the risk of weight related complications. Kylie Dickerson presents today to review program treatment options, initial physical assessment, and evaluation.     Kylie Dickerson was referred by: Specialist  When asked what else they would like to accomplish? Kylie Dickerson states: Adopt healthier eating patterns, Improve quality of life, and Improve self-confidence  When asked how has your weight affected you? Kylie Dickerson states: Problems with eating patterns and Other: Boredom and stress eating  Some associated conditions: Diabetes  Contributing factors: Family history  Weight promoting medications identified: None  Current nutrition plan: Low-carb and Other: Decreased fat content, increase lean protein  Current level of physical activity: Walking  Current or previous pharmacotherapy: None  Response to medication: Never tried medications   Past medical history includes:  History reviewed. No pertinent past medical history.   Objective:   BP 129/88   Pulse 78   Temp 98 F (36.7 C)   Ht 5\' 8"  (1.727 m)   Wt 229 lb (103.9 kg)   SpO2 98%   BMI 34.82 kg/m  Kylie Dickerson was weighed on the bioimpedance scale: Body mass index is 34.82 kg/m. Visceral Fat Rating:11, Body Fat%:42.7.  General:  Alert, oriented and cooperative. Kylie Dickerson is in no acute distress.  Respiratory: Normal respiratory effort, no problems with respiration noted  Extremities: Normal range of motion.    Mental Status: Normal mood and affect. Normal behavior. Normal judgment and thought content.   Assessment and Plan:  1. Family history of diabetes mellitus in father Kylie Dickerson has a paternal history of diabetes melitis (diagnosed in 30's-40's).  Kylie Dickerson has no diagnosis of diabetes mellitus.  Kylie Dickerson will work on lowering her carbohydrates.  2. Other  fatigue Kylie Dickerson notes fatigue with certain activities.  EKG and IC will be obtained at her next visit.  - EKG 12-Lead; Future  3. Elevated cholesterol Kylie Dickerson is not on medications currently.  Kylie Dickerson will work on making better food choices.  4. Obesity, Current BMI 34.8 Kylie Dickerson will follow-up with healthy weight and wellness in 2 weeks for EKG and IC.   We reviewed weight, biometrics, associated medical conditions and contributing factors with Kylie Dickerson. Kylie Dickerson would benefit from weight loss therapy via a modified calorie, low-carb, high-protein nutritional plan tailored to their REE (resting energy expenditure) which will be determined by indirect calorimetry.  We will also assess for cardiometabolic risk and nutritional derangements via fasting serologies at her next appointment.      Obesity Treatment / Action Plan:  Kylie Dickerson will work on garnering support from family and friends to begin weight loss journey. Will work on eliminating or reducing the presence of highly palatable, calorie dense foods in the home. Will complete provided nutritional and psychosocial assessment questionnaire before the next appointment. Will be scheduled for indirect calorimetry to determine resting energy expenditure in a fasting state.  This will allow [place] to create a reduced calorie, high-protein meal plan to promote loss of fat mass while preserving muscle mass. Was counseled on nutritional approaches to weight loss and benefits of complex carbs and high quality protein as part of nutritional weight management. Will work on increasing water intake with a goal of 125 ounces for men and 91 ounces for women.   Obesity Education Performed Today:  Kylie Dickerson was weighed on the bioimpedance scale and results were discussed and documented  in the synopsis.  We discussed obesity as a disease and the importance of a more detailed evaluation of all the factors contributing to the disease.  We discussed the importance of  long term lifestyle changes which include nutrition, exercise and behavioral modifications as well as the importance of customizing this to her specific health and social needs.  We discussed the benefits of reaching a healthier weight to alleviate the symptoms of existing conditions and reduce the risks of the biomechanical, metabolic and psychological effects of obesity.  Kylie Dickerson appears to be in the action stage of change and states they are ready to start intensive lifestyle modifications and behavioral modifications.   Reviewed by clinician on day of visit: allergies, medications, problem list, medical history, surgical history, family history, social history, and previous encounter notes.   Wilhemena Durie, am acting as transcriptionist for [employer], DO   I have reviewed the above documentation for accuracy and completeness, and I agree with the above. Jearld Lesch, DO

## 2022-06-15 ENCOUNTER — Encounter: Payer: Self-pay | Admitting: Bariatrics

## 2022-06-19 ENCOUNTER — Ambulatory Visit: Payer: BC Managed Care – PPO | Admitting: Bariatrics

## 2022-06-19 DIAGNOSIS — Z0289 Encounter for other administrative examinations: Secondary | ICD-10-CM

## 2022-06-26 DIAGNOSIS — F4323 Adjustment disorder with mixed anxiety and depressed mood: Secondary | ICD-10-CM | POA: Diagnosis not present

## 2022-07-03 ENCOUNTER — Other Ambulatory Visit: Payer: Self-pay

## 2022-07-03 ENCOUNTER — Encounter: Payer: Self-pay | Admitting: Bariatrics

## 2022-07-03 ENCOUNTER — Ambulatory Visit: Payer: BC Managed Care – PPO | Admitting: Bariatrics

## 2022-07-03 VITALS — BP 118/81 | HR 82 | Temp 97.9°F | Ht 68.0 in | Wt 225.0 lb

## 2022-07-03 DIAGNOSIS — Z1331 Encounter for screening for depression: Secondary | ICD-10-CM

## 2022-07-03 DIAGNOSIS — R5383 Other fatigue: Secondary | ICD-10-CM

## 2022-07-03 DIAGNOSIS — Z833 Family history of diabetes mellitus: Secondary | ICD-10-CM | POA: Diagnosis not present

## 2022-07-03 DIAGNOSIS — Z6834 Body mass index (BMI) 34.0-34.9, adult: Secondary | ICD-10-CM | POA: Diagnosis not present

## 2022-07-03 DIAGNOSIS — R0602 Shortness of breath: Secondary | ICD-10-CM | POA: Insufficient documentation

## 2022-07-03 DIAGNOSIS — E559 Vitamin D deficiency, unspecified: Secondary | ICD-10-CM

## 2022-07-03 DIAGNOSIS — E669 Obesity, unspecified: Secondary | ICD-10-CM

## 2022-07-03 DIAGNOSIS — Z0000 Encounter for general adult medical examination without abnormal findings: Secondary | ICD-10-CM | POA: Diagnosis not present

## 2022-07-03 DIAGNOSIS — E7800 Pure hypercholesterolemia, unspecified: Secondary | ICD-10-CM | POA: Diagnosis not present

## 2022-07-03 DIAGNOSIS — Z6833 Body mass index (BMI) 33.0-33.9, adult: Secondary | ICD-10-CM | POA: Insufficient documentation

## 2022-07-04 ENCOUNTER — Encounter (INDEPENDENT_AMBULATORY_CARE_PROVIDER_SITE_OTHER): Payer: Self-pay | Admitting: Bariatrics

## 2022-07-04 DIAGNOSIS — R7303 Prediabetes: Secondary | ICD-10-CM | POA: Insufficient documentation

## 2022-07-04 LAB — COMPREHENSIVE METABOLIC PANEL
ALT: 16 IU/L (ref 0–32)
AST: 13 IU/L (ref 0–40)
Albumin/Globulin Ratio: 2.1 (ref 1.2–2.2)
Albumin: 4.7 g/dL (ref 3.9–4.9)
Alkaline Phosphatase: 54 IU/L (ref 44–121)
BUN/Creatinine Ratio: 15 (ref 9–23)
BUN: 10 mg/dL (ref 6–24)
Bilirubin Total: 0.3 mg/dL (ref 0.0–1.2)
CO2: 21 mmol/L (ref 20–29)
Calcium: 9.4 mg/dL (ref 8.7–10.2)
Chloride: 102 mmol/L (ref 96–106)
Creatinine, Ser: 0.67 mg/dL (ref 0.57–1.00)
Globulin, Total: 2.2 g/dL (ref 1.5–4.5)
Glucose: 90 mg/dL (ref 70–99)
Potassium: 4.7 mmol/L (ref 3.5–5.2)
Sodium: 138 mmol/L (ref 134–144)
Total Protein: 6.9 g/dL (ref 6.0–8.5)
eGFR: 108 mL/min/{1.73_m2} (ref >59)

## 2022-07-04 LAB — VITAMIN D 25 HYDROXY (VIT D DEFICIENCY, FRACTURES): Vit D, 25-Hydroxy: 35.4 ng/mL (ref 30.0–100.0)

## 2022-07-04 LAB — LIPID PANEL WITH LDL/HDL RATIO
Cholesterol, Total: 185 mg/dL (ref 100–199)
HDL: 64 mg/dL (ref >39)
LDL Chol Calc (NIH): 103 mg/dL — ABNORMAL HIGH (ref 0–99)
LDL/HDL Ratio: 1.6 ratio (ref 0.0–3.2)
Triglycerides: 100 mg/dL (ref 0–149)
VLDL Cholesterol Cal: 18 mg/dL (ref 5–40)

## 2022-07-04 LAB — HEMOGLOBIN A1C
Est. average glucose Bld gHb Est-mCnc: 120 mg/dL
Hgb A1c MFr Bld: 5.8 % — ABNORMAL HIGH (ref 4.8–5.6)

## 2022-07-04 LAB — INSULIN, RANDOM: INSULIN: 10.3 u[IU]/mL (ref 2.6–24.9)

## 2022-07-13 DIAGNOSIS — F4323 Adjustment disorder with mixed anxiety and depressed mood: Secondary | ICD-10-CM | POA: Diagnosis not present

## 2022-07-17 ENCOUNTER — Encounter: Payer: Self-pay | Admitting: Bariatrics

## 2022-07-17 ENCOUNTER — Ambulatory Visit: Payer: BC Managed Care – PPO | Admitting: Bariatrics

## 2022-07-17 VITALS — BP 117/77 | HR 74 | Temp 97.9°F | Ht 68.0 in | Wt 221.0 lb

## 2022-07-17 DIAGNOSIS — E559 Vitamin D deficiency, unspecified: Secondary | ICD-10-CM

## 2022-07-17 DIAGNOSIS — E669 Obesity, unspecified: Secondary | ICD-10-CM | POA: Diagnosis not present

## 2022-07-17 DIAGNOSIS — R7303 Prediabetes: Secondary | ICD-10-CM | POA: Diagnosis not present

## 2022-07-17 DIAGNOSIS — E7800 Pure hypercholesterolemia, unspecified: Secondary | ICD-10-CM

## 2022-07-17 DIAGNOSIS — Z6834 Body mass index (BMI) 34.0-34.9, adult: Secondary | ICD-10-CM

## 2022-07-17 NOTE — Progress Notes (Unsigned)
Chief Complaint:   OBESITY Kylie Dickerson (MR# [id number]) is a 47 y.o. female who presents for evaluation and treatment of obesity and related comorbidities. Current BMI is Body mass index is 34.21 kg/m. Kylie Dickerson has been struggling with her weight for many years and has been unsuccessful in either losing weight, maintaining weight loss, or reaching her healthy weight goal.  Kylie Dickerson was here for an information session on 06/06/2022. On a scale of 1-10 (8). She struggles with a larger portion size.   Kylie Dickerson is currently in the action stage of change and ready to dedicate time achieving and maintaining a healthier weight. Kylie Dickerson is interested in becoming our patient and working on intensive lifestyle modifications including (but not limited to) diet and exercise for weight loss.  Kylie Dickerson's habits were reviewed today and are as follows: Her family eats meals together, she thinks her family will eat healthier with her, her desired weight loss is 35-45 lbs, she started gaining weight the past few years, her heaviest weight ever was 232 pounds, she is a picky eater and doesn't like to eat healthier foods, she has significant food cravings issues, she is frequently drinking liquids with calories, she frequently makes poor food choices, she frequently eats larger portions than normal, and she struggles with emotional eating.  Depression Screen Kylie Dickerson's Food and Mood (modified PHQ-9) score was 4.  Subjective:   1. Other fatigue Kylie Dickerson admits to daytime somnolence and admits to waking up still tired. Patient has a history of symptoms of daytime fatigue and morning fatigue. Kylie Dickerson generally gets 7 or 8 hours of sleep per night, and states that she has nightime awakenings and generally restful sleep. Snoring is present. Apneic episodes are not present. Epworth Sleepiness Score is 3.   2. SOB (shortness of breath) on exertion Kylie Dickerson notes increasing shortness of breath with exercising  and seems to be worsening over time with weight gain. She notes getting out of breath sooner with activity than she used to. This has not gotten worse recently. Kylie Dickerson denies shortness of breath at rest or orthopnea.  3. Elevated cholesterol Kylie Dickerson is taking fish oil, but no other medications.   4. Family history of diabetes mellitus in father Kylie Dickerson is not on medications currently.   5. Health care maintenance Given obesity.   6. Vitamin D deficiency Kylie Dickerson is not taking vitamins.   Assessment/Plan:   1. Other fatigue Kylie Dickerson does feel that her weight is causing her energy to be lower than it should be. Fatigue may be related to obesity, depression or many other causes. Labs will be ordered, and in the meanwhile, Kylie Dickerson will focus on self care including making healthy food choices, increasing physical activity and focusing on stress reduction.  - EKG 12-Lead  2. SOB (shortness of breath) on exertion Kylie Dickerson does feel that she gets out of breath more easily that she used to when she exercises. Kylie Dickerson's shortness of breath appears to be obesity related and exercise induced. She has agreed to work on weight loss and gradually increase exercise to treat her exercise induced shortness of breath. Will continue to monitor closely.  3. Elevated cholesterol We will check labs today. Kylie Dickerson will continue her fish oil.   - Lipid Panel With LDL/HDL Ratio  4. Family history of diabetes mellitus in father We will check labs today. Kylie Dickerson will keep all carbohydrates low.   - Insulin, random - Hemoglobin A1c  5. Health care maintenance We will check labs today. EKG and  IC were done today, and results were reviewed with the patient.   - Comprehensive metabolic panel - Insulin, random - Hemoglobin A1c - Lipid Panel With LDL/HDL Ratio - VITAMIN D 25 Hydroxy (Vit-D Deficiency, Fractures)  6. Vitamin D deficiency We will check lab today, and will follow-up at her next visit.   -  VITAMIN D 25 Hydroxy (Vit-D Deficiency, Fractures)  7. Depression screening Kylie Dickerson had a negative depression screening.   8. Generalized obesity  9. BMI 34.0-34.9,adult Kylie Dickerson is currently in the action stage of change and her goal is to continue with weight loss efforts. I recommend Kylie Dickerson begin the structured treatment plan as follows:  She has agreed to the Category 2 Plan.  She will begin her meal plan. Meal planning was discussed. Reviewed labs from 04/07/2022, CBC, TSH, urine culture. She will slow down her eating and portion sizes.   Exercise goals: Physical at work with standing and walking, and some lifting.    Behavioral modification strategies: increasing lean protein intake, decreasing simple carbohydrates, increasing vegetables, increasing water intake, decreasing eating out, no skipping meals, meal planning and cooking strategies, keeping healthy foods in the home, and planning for success.  She was informed of the importance of frequent follow-up visits to maximize her success with intensive lifestyle modifications for her multiple health conditions. She was informed we would discuss her lab results at her next visit unless there is a critical issue that needs to be addressed sooner. Kylie Dickerson agreed to keep her next visit at the agreed upon time to discuss these results.  Objective:   Blood pressure 118/81, pulse 82, temperature 97.9 F (36.6 C), height '5\' 8"'$  (1.727 m), weight 225 lb (102.1 kg), SpO2 97 %. Body mass index is 34.21 kg/m.  EKG: Normal sinus rhythm, rate 80 BPM.  Indirect Calorimeter completed today shows a VO2 of 240 and a REE of 1656.  Her calculated basal metabolic rate is [id number] thus her basal metabolic rate is worse than expected.  General: Cooperative, alert, well developed, in no acute distress. HEENT: Conjunctivae and lids unremarkable. Cardiovascular: Regular rhythm.  Lungs: Normal work of breathing. Neurologic: No focal deficits.   Lab  Results  Component Value Date   CREATININE 0.67 07/03/2022   BUN 10 07/03/2022   NA 138 07/03/2022   K 4.7 07/03/2022   CL 102 07/03/2022   CO2 21 07/03/2022   Lab Results  Component Value Date   ALT 16 07/03/2022   AST 13 07/03/2022   ALKPHOS 54 07/03/2022   BILITOT 0.3 07/03/2022   Lab Results  Component Value Date   HGBA1C 5.8 (H) 07/03/2022   Lab Results  Component Value Date   INSULIN 10.3 07/03/2022   Lab Results  Component Value Date   TSH 2.57 04/04/2022   Lab Results  Component Value Date   CHOL 185 07/03/2022   HDL 64 07/03/2022   LDLCALC 103 (H) 07/03/2022   TRIG 100 07/03/2022   Lab Results  Component Value Date   WBC 6.9 04/04/2022   HGB 14.2 04/04/2022   HCT 41 04/04/2022   PLT 287 04/04/2022   No results found for: "IRON", "TIBC", "FERRITIN"  Attestation Statements:   Reviewed by clinician on day of visit: allergies, medications, problem list, medical history, surgical history, family history, social history, and previous encounter notes.   Wilhemena Durie, am acting as Location manager for [employer], DO.  I have reviewed the above documentation for accuracy and completeness, and I agree with the above. - ***

## 2022-07-19 DIAGNOSIS — M7062 Trochanteric bursitis, left hip: Secondary | ICD-10-CM | POA: Diagnosis not present

## 2022-07-31 ENCOUNTER — Encounter: Payer: Self-pay | Admitting: Bariatrics

## 2022-07-31 ENCOUNTER — Ambulatory Visit: Payer: BC Managed Care – PPO | Admitting: Bariatrics

## 2022-07-31 VITALS — BP 122/79 | HR 68 | Temp 97.8°F | Ht 68.0 in | Wt 220.0 lb

## 2022-07-31 DIAGNOSIS — Z6833 Body mass index (BMI) 33.0-33.9, adult: Secondary | ICD-10-CM

## 2022-07-31 DIAGNOSIS — E669 Obesity, unspecified: Secondary | ICD-10-CM

## 2022-07-31 DIAGNOSIS — E559 Vitamin D deficiency, unspecified: Secondary | ICD-10-CM | POA: Diagnosis not present

## 2022-07-31 DIAGNOSIS — F4323 Adjustment disorder with mixed anxiety and depressed mood: Secondary | ICD-10-CM | POA: Diagnosis not present

## 2022-07-31 DIAGNOSIS — R7303 Prediabetes: Secondary | ICD-10-CM | POA: Diagnosis not present

## 2022-08-01 NOTE — Progress Notes (Signed)
Chief Complaint:   OBESITY Kylie Dickerson is here to discuss her progress with her obesity treatment plan along with follow-up of her obesity related diagnoses. Kylie Dickerson is on the Category 2 plan and states she is following her eating plan approximately 100% of the time. [name] states she has not been exercising.  Today's visit was #: 2 Starting weight: 225 lbs Starting date: 07/03/22 Today's weight: 221 lbs Today's date: 07/17/22 Total lbs lost to date: 4 Total lbs lost since last in-office visit: -4  Interim History: She is down 4 pounds since her first visit.  She is make eating better choices and eating low calorie snacks.  Subjective:   1. Prediabetes Hemoglobin A1c-5.8. No medications.  2. Elevated cholesterol Last LDL-103.  HDL and triglycerides within normal limits.  3. Vitamin D deficiency Taking vitamin D over-the-counter.  Assessment/Plan:   1. Prediabetes 1.  Will minimize all carbohydrates (sweets and starches). 2.  Handout -  insulin resistance and diabetes. 3.  Handout-metformin.  2. Elevated cholesterol 1.  No trans fats. 2.  Keep saturated fats low.  3. Vitamin D deficiency 1.  Continue vitamin D.  4. Generalized obesity  BMI 34.0-34.9,adult 1.  Meal planning. 2.  Reviewed labs from 07/03/2022: CMP, lipids, vitamin D, hemoglobin A1c, insulin. 3.  Continue to journal. 4.  Handout-eating out. 5.  Lunch and dinner options.  Kylie Dickerson is currently in the action stage of change. As such, her goal is to continue with weight loss efforts. She has agreed to the Category 2 plan.  Exercise goals: No exercise has been prescribed at this time.  Behavioral modification strategies: increasing lean protein intake, decreasing simple carbohydrates, increasing vegetables, increasing water intake, decreasing eating out, no skipping meals, meal planning and cooking strategies, keeping healthy foods in the home, and planning for success.  Justin has agreed to follow-up  with our clinic in 2 weeks. She was informed of the importance of frequent follow-up visits to maximize her success with intensive lifestyle modifications for her multiple health conditions.   Objective:   Blood pressure 117/77, pulse 74, temperature 97.9 F (36.6 C), height '5\' 8"'$  (1.727 m), weight 221 lb (100.2 kg), SpO2 98 %. Body mass index is 33.6 kg/m.  General: Cooperative, alert, well developed, in no acute distress. HEENT: Conjunctivae and lids unremarkable. Cardiovascular: Regular rhythm.  Lungs: Normal work of breathing. Neurologic: No focal deficits.   Lab Results  Component Value Date   CREATININE 0.67 07/03/2022   BUN 10 07/03/2022   NA 138 07/03/2022   K 4.7 07/03/2022   CL 102 07/03/2022   CO2 21 07/03/2022   Lab Results  Component Value Date   ALT 16 07/03/2022   AST 13 07/03/2022   ALKPHOS 54 07/03/2022   BILITOT 0.3 07/03/2022   Lab Results  Component Value Date   HGBA1C 5.8 (H) 07/03/2022   Lab Results  Component Value Date   INSULIN 10.3 07/03/2022   Lab Results  Component Value Date   TSH 2.57 04/04/2022   Lab Results  Component Value Date   CHOL 185 07/03/2022   HDL 64 07/03/2022   LDLCALC 103 (H) 07/03/2022   TRIG 100 07/03/2022   Lab Results  Component Value Date   VD25OH 35.4 07/03/2022   Lab Results  Component Value Date   WBC 6.9 04/04/2022   HGB 14.2 04/04/2022   HCT 41 04/04/2022   PLT 287 04/04/2022   No results found for: "IRON", "TIBC", "FERRITIN"  Attestation Statements:  Reviewed by clinician on day of visit: allergies, medications, problem list, medical history, surgical history, family history, social history, and previous encounter notes.  I, Dawn Whitmire, FNP-C, am acting as transcriptionist for Dr. Jearld Lesch.  I have reviewed the above documentation for accuracy and completeness, and I agree with the above. Jearld Lesch, DO

## 2022-08-02 DIAGNOSIS — Z1339 Encounter for screening examination for other mental health and behavioral disorders: Secondary | ICD-10-CM | POA: Diagnosis not present

## 2022-08-02 DIAGNOSIS — F325 Major depressive disorder, single episode, in full remission: Secondary | ICD-10-CM | POA: Diagnosis not present

## 2022-08-02 DIAGNOSIS — Z1331 Encounter for screening for depression: Secondary | ICD-10-CM | POA: Diagnosis not present

## 2022-08-02 DIAGNOSIS — G4700 Insomnia, unspecified: Secondary | ICD-10-CM | POA: Diagnosis not present

## 2022-08-02 DIAGNOSIS — R7301 Impaired fasting glucose: Secondary | ICD-10-CM | POA: Diagnosis not present

## 2022-08-02 DIAGNOSIS — E669 Obesity, unspecified: Secondary | ICD-10-CM | POA: Diagnosis not present

## 2022-08-02 DIAGNOSIS — Z0000 Encounter for general adult medical examination without abnormal findings: Secondary | ICD-10-CM | POA: Diagnosis not present

## 2022-08-02 DIAGNOSIS — N390 Urinary tract infection, site not specified: Secondary | ICD-10-CM | POA: Diagnosis not present

## 2022-08-09 NOTE — Progress Notes (Signed)
Chief Complaint:   OBESITY Kylie Dickerson is here to discuss her progress with her obesity treatment plan along with follow-up of her obesity related diagnoses. Kylie Dickerson is on the Category 2 plan and states she is following her eating plan approximately 98% of the time. Kylie Dickerson states she has not been exercising.  Today's visit was #: 3 Starting weight: 225 lbs Starting date: 07/03/22 Today's weight: 220 lbs Today's date: 07/31/22 Total lbs lost to date: 5 Total lbs lost since last in-office visit: -1  Interim History: She is down 1 additional pound since her last visit.  She has not been tracking as well.  Subjective:   1. Prediabetes No medications.  2. Vitamin D deficiency Taking vitamin D (high-dose).   Assessment/Plan:   1. Prediabetes 1.  Decrease carbohydrates (sweets and starches).  2. Vitamin D deficiency Continue vitamin D.  3. Generalized obesity BMI 33.0-33.9,adult 1.  Meal planning. 2.  Will continue to adhere closely to the plan. 3.  Handout -Recipes II  Kylie Dickerson is currently in the action stage of change. As such, her goal is to continue with weight loss efforts. She has agreed to the Category 2 plan.  Exercise goals: Will consider exercise.  Will continue to walk with the dog and do yard work.  Behavioral modification strategies: increasing lean protein intake, decreasing simple carbohydrates, increasing vegetables, increasing water intake, decreasing eating out, no skipping meals, meal planning and cooking strategies, keeping healthy foods in the home, and planning for success.  Kylie Dickerson has agreed to follow-up with our clinic in 2 weeks. She was informed of the importance of frequent follow-up visits to maximize her success with intensive lifestyle modifications for her multiple health conditions.   Objective:   Blood pressure 122/79, pulse 68, temperature 97.8 F (36.6 C), height 5\' 8"  (1.727 m), weight 220 lb (99.8 kg), SpO2 100 %. Body mass index is  33.45 kg/m.  General: Cooperative, alert, well developed, in no acute distress. HEENT: Conjunctivae and lids unremarkable. Cardiovascular: Regular rhythm.  Lungs: Normal work of breathing. Neurologic: No focal deficits.   Lab Results  Component Value Date   CREATININE 0.67 07/03/2022   BUN 10 07/03/2022   NA 138 07/03/2022   K 4.7 07/03/2022   CL 102 07/03/2022   CO2 21 07/03/2022   Lab Results  Component Value Date   ALT 16 07/03/2022   AST 13 07/03/2022   ALKPHOS 54 07/03/2022   BILITOT 0.3 07/03/2022   Lab Results  Component Value Date   HGBA1C 5.8 (H) 07/03/2022   Lab Results  Component Value Date   INSULIN 10.3 07/03/2022   Lab Results  Component Value Date   TSH 2.57 04/04/2022   Lab Results  Component Value Date   CHOL 185 07/03/2022   HDL 64 07/03/2022   LDLCALC 103 (H) 07/03/2022   TRIG 100 07/03/2022   Lab Results  Component Value Date   VD25OH 35.4 07/03/2022   Lab Results  Component Value Date   WBC 6.9 04/04/2022   HGB 14.2 04/04/2022   HCT 41 04/04/2022   PLT 287 04/04/2022   No results found for: "IRON", "TIBC", "FERRITIN"  Attestation Statements:   Reviewed by clinician on day of visit: allergies, medications, problem list, medical history, surgical history, family history, social history, and previous encounter notes.  I, Dawn Whitmire, FNP-C, am acting as transcriptionist for Dr. Jearld Lesch.  I have reviewed the above documentation for accuracy and completeness, and I agree with the above. -  Jearld Lesch, DO

## 2022-08-15 DIAGNOSIS — J029 Acute pharyngitis, unspecified: Secondary | ICD-10-CM | POA: Diagnosis not present

## 2022-08-15 DIAGNOSIS — R051 Acute cough: Secondary | ICD-10-CM | POA: Diagnosis not present

## 2022-08-15 DIAGNOSIS — Z03818 Encounter for observation for suspected exposure to other biological agents ruled out: Secondary | ICD-10-CM | POA: Diagnosis not present

## 2022-08-16 ENCOUNTER — Encounter: Payer: Self-pay | Admitting: Bariatrics

## 2022-08-17 DIAGNOSIS — J069 Acute upper respiratory infection, unspecified: Secondary | ICD-10-CM | POA: Diagnosis not present

## 2022-08-17 DIAGNOSIS — G43909 Migraine, unspecified, not intractable, without status migrainosus: Secondary | ICD-10-CM | POA: Diagnosis not present

## 2022-08-17 DIAGNOSIS — H6503 Acute serous otitis media, bilateral: Secondary | ICD-10-CM | POA: Diagnosis not present

## 2022-08-17 DIAGNOSIS — J029 Acute pharyngitis, unspecified: Secondary | ICD-10-CM | POA: Diagnosis not present

## 2022-08-17 DIAGNOSIS — M791 Myalgia, unspecified site: Secondary | ICD-10-CM | POA: Diagnosis not present

## 2022-08-17 DIAGNOSIS — Z1152 Encounter for screening for COVID-19: Secondary | ICD-10-CM | POA: Diagnosis not present

## 2022-08-17 DIAGNOSIS — R0981 Nasal congestion: Secondary | ICD-10-CM | POA: Diagnosis not present

## 2022-08-21 DIAGNOSIS — J029 Acute pharyngitis, unspecified: Secondary | ICD-10-CM | POA: Diagnosis not present

## 2022-08-21 DIAGNOSIS — J069 Acute upper respiratory infection, unspecified: Secondary | ICD-10-CM | POA: Diagnosis not present

## 2022-08-21 DIAGNOSIS — H5789 Other specified disorders of eye and adnexa: Secondary | ICD-10-CM | POA: Diagnosis not present

## 2022-08-21 DIAGNOSIS — H6503 Acute serous otitis media, bilateral: Secondary | ICD-10-CM | POA: Diagnosis not present

## 2022-08-23 ENCOUNTER — Ambulatory Visit: Payer: BC Managed Care – PPO | Admitting: Bariatrics

## 2022-08-23 ENCOUNTER — Encounter: Payer: Self-pay | Admitting: Bariatrics

## 2022-08-23 VITALS — BP 128/83 | HR 92 | Temp 98.2°F | Ht 68.0 in | Wt 215.0 lb

## 2022-08-23 DIAGNOSIS — F411 Generalized anxiety disorder: Secondary | ICD-10-CM | POA: Diagnosis not present

## 2022-08-23 DIAGNOSIS — Z6833 Body mass index (BMI) 33.0-33.9, adult: Secondary | ICD-10-CM

## 2022-08-23 DIAGNOSIS — E559 Vitamin D deficiency, unspecified: Secondary | ICD-10-CM | POA: Diagnosis not present

## 2022-08-23 DIAGNOSIS — E7800 Pure hypercholesterolemia, unspecified: Secondary | ICD-10-CM | POA: Diagnosis not present

## 2022-08-23 DIAGNOSIS — E669 Obesity, unspecified: Secondary | ICD-10-CM

## 2022-08-23 DIAGNOSIS — F4323 Adjustment disorder with mixed anxiety and depressed mood: Secondary | ICD-10-CM | POA: Diagnosis not present

## 2022-08-23 DIAGNOSIS — F331 Major depressive disorder, recurrent, moderate: Secondary | ICD-10-CM | POA: Diagnosis not present

## 2022-08-24 NOTE — Progress Notes (Signed)
Chief Complaint:   OBESITY Kylie Dickerson is here to discuss her progress with her obesity treatment plan along with follow-up of her obesity related diagnoses. Kylie Dickerson is on the Category 2 Plan and states she is following her eating plan approximately 100% of the time. Kylie Dickerson states she is not exercising.  Today's visit was #: 4 Starting weight: 225 LBS Starting date: 07/03/2022 Today's weight: 215 LBS Today's date: 08/23/2022 Total lbs lost to date: 10 LBS Total lbs lost since last in-office visit: 5 LBS  Interim History: Patient is down an additional 5 LBS since her last visit.  She was sick for a couple of weeks.  She is doing well with her water.  Subjective:   1. Elevated cholesterol Patient is taking omega-3.  2. Vitamin D deficiency Patient is taking vitamin D.  Assessment/Plan:   1. Elevated cholesterol 1.  Continue omega-3. 2.  No trans fats, limit saturated fats. 3.  Continue to cut out refined carbohydrates.  2. Vitamin D deficiency Continue vitamin D.  Supplement list given to patient.  Consider herbal natural medications.  3. Generalized obesity  4. BMI 33.0-33.9,adult Kylie Dickerson is currently in the action stage of change. As such, her goal is to continue with weight loss efforts. She has agreed to the Category 2 Plan.   1.  Meal planning. 2.  Intentional eating. 3.  Continue the meal plan at 100%. 4.  Journaling and will continue. 5.  Keep fiber high.  Exercise goals:  Continue walking and yard work.  Behavioral modification strategies: increasing lean protein intake, decreasing simple carbohydrates, increasing vegetables, increasing water intake, decreasing eating out, no skipping meals, meal planning and cooking strategies, keeping healthy foods in the home, and planning for success.  Kylie Dickerson has agreed to follow-up with our clinic in 2-4 weeks. She was informed of the importance of frequent follow-up visits to maximize her success with intensive lifestyle  modifications for her multiple health conditions.   Objective:   Blood pressure 128/83, pulse 92, temperature 98.2 F (36.8 C), height 5\' 8"  (1.727 m), weight 215 lb (97.5 kg), SpO2 98 %. Body mass index is 32.69 kg/m.  General: Cooperative, alert, well developed, in no acute distress. HEENT: Conjunctivae and lids unremarkable. Cardiovascular: Regular rhythm.  Lungs: Normal work of breathing. Neurologic: No focal deficits.   Lab Results  Component Value Date   CREATININE 0.67 07/03/2022   BUN 10 07/03/2022   NA 138 07/03/2022   K 4.7 07/03/2022   CL 102 07/03/2022   CO2 21 07/03/2022   Lab Results  Component Value Date   ALT 16 07/03/2022   AST 13 07/03/2022   ALKPHOS 54 07/03/2022   BILITOT 0.3 07/03/2022   Lab Results  Component Value Date   HGBA1C 5.8 (H) 07/03/2022   Lab Results  Component Value Date   INSULIN 10.3 07/03/2022   Lab Results  Component Value Date   TSH 2.57 04/04/2022   Lab Results  Component Value Date   CHOL 185 07/03/2022   HDL 64 07/03/2022   LDLCALC 103 (H) 07/03/2022   TRIG 100 07/03/2022   Lab Results  Component Value Date   VD25OH 35.4 07/03/2022   Lab Results  Component Value Date   WBC 6.9 04/04/2022   HGB 14.2 04/04/2022   HCT 41 04/04/2022   PLT 287 04/04/2022   No results found for: "IRON", "TIBC", "FERRITIN"  Attestation Statements:   Reviewed by clinician on day of visit: allergies, medications, problem list, medical history,  surgical history, family history, social history, and previous encounter notes.  Delfina Redwood, am acting as Location manager for [employer], DO.  I have reviewed the above documentation for accuracy and completeness, and I agree with the above. Jearld Lesch, DO

## 2022-09-05 DIAGNOSIS — J029 Acute pharyngitis, unspecified: Secondary | ICD-10-CM | POA: Diagnosis not present

## 2022-09-05 DIAGNOSIS — H6503 Acute serous otitis media, bilateral: Secondary | ICD-10-CM | POA: Diagnosis not present

## 2022-09-05 DIAGNOSIS — J069 Acute upper respiratory infection, unspecified: Secondary | ICD-10-CM | POA: Diagnosis not present

## 2022-09-05 DIAGNOSIS — H5789 Other specified disorders of eye and adnexa: Secondary | ICD-10-CM | POA: Diagnosis not present

## 2022-09-06 ENCOUNTER — Ambulatory Visit: Payer: BC Managed Care – PPO | Admitting: Nurse Practitioner

## 2022-09-07 DIAGNOSIS — F4323 Adjustment disorder with mixed anxiety and depressed mood: Secondary | ICD-10-CM | POA: Diagnosis not present

## 2022-09-20 ENCOUNTER — Encounter: Payer: Self-pay | Admitting: Bariatrics

## 2022-09-20 ENCOUNTER — Ambulatory Visit: Payer: BC Managed Care – PPO | Admitting: Bariatrics

## 2022-09-20 VITALS — BP 113/72 | HR 79 | Temp 98.1°F | Ht 68.0 in | Wt 212.0 lb

## 2022-09-20 DIAGNOSIS — R7303 Prediabetes: Secondary | ICD-10-CM | POA: Diagnosis not present

## 2022-09-20 DIAGNOSIS — Z6832 Body mass index (BMI) 32.0-32.9, adult: Secondary | ICD-10-CM | POA: Diagnosis not present

## 2022-09-20 DIAGNOSIS — E669 Obesity, unspecified: Secondary | ICD-10-CM

## 2022-09-26 NOTE — Progress Notes (Unsigned)
     Chief Complaint:   OBESITY Kylie Dickerson is here to discuss her progress with her obesity treatment plan along with follow-up of her obesity related diagnoses. Kylie Dickerson is on the Category 2 Plan and states she is following her eating plan approximately 60% of the time. Kylie Dickerson states she is doing 0 minutes 0 times per week.  Today's visit was #: 5 Starting weight: 225 lbs Starting date: 07/03/2022 Today's weight: 212 lbs Today's date: 09/20/2022 Total lbs lost to date: 13 Total lbs lost since last in-office visit: 3  Interim History: Kylie Dickerson is down another 3 lbs since her last visit. She is getting in water, protein, and fiber.   Subjective:   1. Prediabetes Kylie Dickerson is not on medications.   Assessment/Plan:   1. Prediabetes Kylie Dickerson will keep all carbohydrates low (sugar and starches).   2. Generalized obesity  3. BMI 32.0-32.9,adult Kylie Dickerson is currently in the action stage of change. As such, her goal is to continue with weight loss efforts. She has agreed to the Category 2 Plan.   Meal planning and intentional eating were discussed. Healthy snacks and anti-inflammatory options were discussed.   Exercise goals: Yard work.   Behavioral modification strategies: increasing lean protein intake, decreasing simple carbohydrates, increasing vegetables, increasing water intake, decreasing eating out, no skipping meals, meal planning and cooking strategies, keeping healthy foods in the home, and planning for success.  Kylie Dickerson has agreed to follow-up with our clinic in 2 weeks. She was informed of the importance of frequent follow-up visits to maximize her success with intensive lifestyle modifications for her multiple health conditions.   Objective:   Blood pressure 113/72, pulse 79, temperature 98.1 F (36.7 C), height 5\' 8"  (1.727 m), weight 212 lb (96.2 kg), SpO2 97 %. Body mass index is 32.23 kg/m.  General: Cooperative, alert, well developed, in no acute distress. HEENT:  Conjunctivae and lids unremarkable. Cardiovascular: Regular rhythm.  Lungs: Normal work of breathing. Neurologic: No focal deficits.   Lab Results  Component Value Date   CREATININE 0.67 07/03/2022   BUN 10 07/03/2022   NA 138 07/03/2022   K 4.7 07/03/2022   CL 102 07/03/2022   CO2 21 07/03/2022   Lab Results  Component Value Date   ALT 16 07/03/2022   AST 13 07/03/2022   ALKPHOS 54 07/03/2022   BILITOT 0.3 07/03/2022   Lab Results  Component Value Date   HGBA1C 5.8 (H) 07/03/2022   Lab Results  Component Value Date   INSULIN 10.3 07/03/2022   Lab Results  Component Value Date   TSH 2.57 04/04/2022   Lab Results  Component Value Date   CHOL 185 07/03/2022   HDL 64 07/03/2022   LDLCALC 103 (H) 07/03/2022   TRIG 100 07/03/2022   Lab Results  Component Value Date   VD25OH 35.4 07/03/2022   Lab Results  Component Value Date   WBC 6.9 04/04/2022   HGB 14.2 04/04/2022   HCT 41 04/04/2022   PLT 287 04/04/2022   No results found for: "IRON", "TIBC", "FERRITIN"  Attestation Statements:   Reviewed by clinician on day of visit: allergies, medications, problem list, medical history, surgical history, family history, social history, and previous encounter notes.   Trude Mcburney, am acting as Energy manager for [employer], DO.  I have reviewed the above documentation for accuracy and completeness, and I agree with the above. Corinna Capra, DO

## 2022-09-27 ENCOUNTER — Encounter: Payer: Self-pay | Admitting: Bariatrics

## 2022-09-27 ENCOUNTER — Ambulatory Visit: Payer: BC Managed Care – PPO | Admitting: Nurse Practitioner

## 2022-10-05 ENCOUNTER — Encounter: Payer: Self-pay | Admitting: Nurse Practitioner

## 2022-10-05 ENCOUNTER — Ambulatory Visit: Payer: BC Managed Care – PPO | Admitting: Nurse Practitioner

## 2022-10-05 VITALS — BP 133/86 | HR 72 | Temp 98.4°F | Ht 68.0 in | Wt 210.0 lb

## 2022-10-05 DIAGNOSIS — E669 Obesity, unspecified: Secondary | ICD-10-CM | POA: Diagnosis not present

## 2022-10-05 DIAGNOSIS — Z6831 Body mass index (BMI) 31.0-31.9, adult: Secondary | ICD-10-CM | POA: Diagnosis not present

## 2022-10-05 DIAGNOSIS — R7303 Prediabetes: Secondary | ICD-10-CM

## 2022-10-05 DIAGNOSIS — F4323 Adjustment disorder with mixed anxiety and depressed mood: Secondary | ICD-10-CM | POA: Diagnosis not present

## 2022-10-05 NOTE — Progress Notes (Signed)
Office: [phone]  /  Fax: [phone]  WEIGHT SUMMARY AND BIOMETRICS  Weight Lost Since Last Visit: 2lb  No data recorded  Vitals Temp: 98.4 F (36.9 C) BP: 133/86 Pulse Rate: 72 SpO2: 98 %   Anthropometric Measurements Height: 5\' 8"  (1.727 m) Weight: 210 lb (95.3 kg) BMI (Calculated): 31.94 Weight at Last Visit: 212lb Weight Lost Since Last Visit: 2lb Starting Weight: 225lb Total Weight Loss (lbs): 15 lb (6.804 kg)   Body Composition  Body Fat %: 38.6 % Fat Mass (lbs): 81 lbs Muscle Mass (lbs): 122.4 lbs Total Body Water (lbs): 83.2 lbs Visceral Fat Rating : 9   Other Clinical Data Fasting: No Labs: No Today's Visit #: 6 Starting Date: 07/03/22     HPI  Chief Complaint: OBESITY  Kylie Dickerson is here to discuss her progress with her obesity treatment plan. She is on the the Category 2 Plan and states she is following her eating plan approximately 100 % of the time. She states she is exercising 0 minutes 0 days per week.   Interval History:  Since last office visit she has lost 2 pounds.  Her highest weight was 232 lbs.  She has overall done well with weight loss.  Plans to start training this summer for a 30 mile walk (Oct 2024).  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  She walks around 3 miles when she is at work 5 days per week.  Goal weight 180-190 lbs.     Pharmacotherapy for weight loss: She is not currently taking medications  for medical weight loss.  Denies side effects.    Previous pharmacotherapy for medical weight loss:  none  Bariatric surgery:  Patient has not had bariatric surgery.    Prediabetes Last A1c was 5.8  Medication(s): not currently on meds FH: brother DMT1, mother, mgm, father and uncle with DMT2  Lab Results  Component Value Date   HGBA1C 5.8 (H) 07/03/2022   Lab Results  Component Value Date   INSULIN 10.3 07/03/2022     PHYSICAL EXAM:  Blood  pressure 133/86, pulse 72, temperature 98.4 F (36.9 C), height 5\' 8"  (1.727 m), weight 210 lb (95.3 kg), SpO2 98 %. Body mass index is 31.93 kg/m.  General: She is overweight, cooperative, alert, well developed, and in no acute distress. PSYCH: Has normal mood, affect and thought process.   Extremities: No edema.  Neurologic: No gross sensory or motor deficits. No tremors or fasciculations noted.    DIAGNOSTIC DATA REVIEWED:  BMET    Component Value Date/Time   NA 138 07/03/2022 1139   K 4.7 07/03/2022 1139   CL 102 07/03/2022 1139   CO2 21 07/03/2022 1139   GLUCOSE 90 07/03/2022 1139   BUN 10 07/03/2022 1139   CREATININE 0.67 07/03/2022 1139   CALCIUM 9.4 07/03/2022 1139   Lab Results  Component Value Date   HGBA1C 5.8 (H) 07/03/2022   Lab Results  Component Value Date   INSULIN 10.3 07/03/2022   Lab Results  Component Value Date   TSH 2.57 04/04/2022   CBC    Component Value Date/Time   WBC 6.9 04/04/2022 0000   RBC 4.45 04/04/2022 0000   HGB 14.2 04/04/2022 0000   HCT 41 04/04/2022 0000   PLT 287 04/04/2022 0000   Iron Studies No results found for: "IRON", "TIBC", "FERRITIN", "IRONPCTSAT" Lipid Panel     Component Value Date/Time  CHOL 185 07/03/2022 1139   TRIG 100 07/03/2022 1139   HDL 64 07/03/2022 1139   LDLCALC 103 (H) 07/03/2022 1139   Hepatic Function Panel     Component Value Date/Time   PROT 6.9 07/03/2022 1139   ALBUMIN 4.7 07/03/2022 1139   AST 13 07/03/2022 1139   ALT 16 07/03/2022 1139   ALKPHOS 54 07/03/2022 1139   BILITOT 0.3 07/03/2022 1139      Component Value Date/Time   TSH 2.57 04/04/2022 0000   Nutritional Lab Results  Component Value Date   VD25OH 35.4 07/03/2022     ASSESSMENT AND PLAN  TREATMENT PLAN FOR OBESITY:  Recommended Dietary Goals  Kylie Dickerson is currently in the action stage of change. As such, her goal is to continue weight management plan. She has agreed to the Category 2 Plan.  Behavioral  Intervention  We discussed the following Behavioral Modification Strategies today: increasing lean protein intake, decreasing simple carbohydrates , increasing vegetables, increasing lower glycemic fruits, increasing water intake, keeping healthy foods at home, identifying sources and decreasing liquid calories, continue to practice mindfulness when eating, and planning for success.  Additional resources provided today: NA  Recommended Physical Activity Goals  Kylie Dickerson has been advised to work up to 150 minutes of moderate intensity aerobic activity a week and strengthening exercises 2-3 times per week for cardiovascular health, weight loss maintenance and preservation of muscle mass.   She has agreed to Continue current level of physical activity  and Think about ways to increase daily physical activity and overcoming barriers to exercise   ASSOCIATED CONDITIONS ADDRESSED TODAY  Action/Plan  Prediabetes Kylie Dickerson will continue to work on weight loss, exercise, and decreasing simple carbohydrates to help decrease the risk of diabetes.    Generalized obesity  BMI 31.0-31.9,adult         Return in about 3 weeks (around 10/26/2022).Marland Kitchen She was informed of the importance of frequent follow up visits to maximize her success with intensive lifestyle modifications for her multiple health conditions.   ATTESTASTION STATEMENTS:  Reviewed by clinician on day of visit: allergies, medications, problem list, medical history, surgical history, family history, social history, and previous encounter notes.   Time spent on visit including pre-visit chart review and post-visit care and charting was 30 minutes.    Theodis Sato. Tenia Goh FNP-C

## 2022-10-16 DIAGNOSIS — F4323 Adjustment disorder with mixed anxiety and depressed mood: Secondary | ICD-10-CM | POA: Diagnosis not present

## 2022-10-25 ENCOUNTER — Ambulatory Visit: Payer: BC Managed Care – PPO | Admitting: Bariatrics

## 2022-10-25 ENCOUNTER — Encounter: Payer: Self-pay | Admitting: Bariatrics

## 2022-10-25 VITALS — BP 116/74 | HR 77 | Temp 97.8°F | Ht 68.0 in | Wt 210.0 lb

## 2022-10-25 DIAGNOSIS — E669 Obesity, unspecified: Secondary | ICD-10-CM | POA: Diagnosis not present

## 2022-10-25 DIAGNOSIS — Z6831 Body mass index (BMI) 31.0-31.9, adult: Secondary | ICD-10-CM

## 2022-10-25 DIAGNOSIS — R7303 Prediabetes: Secondary | ICD-10-CM

## 2022-10-25 NOTE — Progress Notes (Signed)
   WEIGHT SUMMARY AND BIOMETRICS  Weight Lost Since Last Visit: 0  Weight Gained Since Last Visit: 0   Vitals Temp: 97.8 F (36.6 C) BP: 116/74 Pulse Rate: 77 SpO2: 97 %   Anthropometric Measurements Height: 5\' 8"  (1.727 m) Weight: 210 lb (95.3 kg) BMI (Calculated): 31.94 Weight at Last Visit: 210lb Weight Lost Since Last Visit: 0 Weight Gained Since Last Visit: 0 Starting Weight: 225lb Total Weight Loss (lbs): 15 lb (6.804 kg)   Body Composition  Body Fat %: 40 % Fat Mass (lbs): 84 lbs Muscle Mass (lbs): 119.8 lbs Total Body Water (lbs): 86.8 lbs Visceral Fat Rating : 9   Other Clinical Data Fasting: no Labs: no Today's Visit #: 7 Starting Date: 07/03/22    OBESITY Kylie Dickerson is here to discuss her progress with her obesity treatment plan along with follow-up of her obesity related diagnoses.     Nutrition Plan: the Category 2 plan - 95% adherence.  Current exercise: yard work  Interim History:  She is maintaining her weight. She has had some events and celebrations.  Is not exceeding snack calorie allotment, Is not skipping meals, Meeting protein goals., and Water intake is inadequate.  Hunger is moderately controlled.  Cravings are moderately controlled.  Assessment/Plan:   Prediabetes Last A1c was 5.8   Lab Results  Component Value Date   HGBA1C 5.8 (H) 07/03/2022   Lab Results  Component Value Date   INSULIN 10.3 07/03/2022    Plan:  Will minimize all refined carbohydrates both sweets and starches.  Will work on the plan and exercise.  Consider both aerobic and resistance training.  Will keep protein, water, and fiber intake high.  Increase Polyunsaturated and Monounsaturated fats to increase satiety and encourage weight loss.  Aim for 7 to 9 hours of sleep nightly.  Will continue medications.     Generalized Obesity: Current BMI BMI (Calculated): 31.94    Kylie Dickerson is currently in the action stage of change. As such, her  goal is to continue with weight loss efforts.  She has agreed to the Category 2 plan.  Exercise goals: For substantial health benefits, adults should do at least 150 minutes (2 hours and 30 minutes) a week of moderate-intensity, or 75 minutes (1 hour and 15 minutes) a week of vigorous-intensity aerobic physical activity, or an equivalent combination of moderate- and vigorous-intensity aerobic activity. Aerobic activity should be performed in episodes of at least 10 minutes, and preferably, it should be spread throughout the week.  Behavioral modification strategies: increasing lean protein intake, meal planning , increase water intake, better snacking choices, planning for success, and increasing fiber rich foods.  Kylie Dickerson has agreed to follow-up with our clinic in 4 weeks.  Will get fasting labs at the next visit.      Objective:   VITALS: Per patient if applicable, see vitals. GENERAL: Alert and in no acute distress. CARDIOPULMONARY: No increased WOB. Speaking in clear sentences.  PSYCH: Pleasant and cooperative. Speech normal rate and rhythm. Affect is appropriate. Insight and judgement are appropriate. Attention is focused, linear, and appropriate.  NEURO: Oriented as arrived to appointment on time with no prompting.   Attestation Statements:    This was prepared with the assistance of Engineer, civil (consulting).  Occasional wrong-word or sound-a-like substitutions may have occurred due to the inherent limitations of voice recognition software.   Corinna Capra, DO

## 2022-11-02 DIAGNOSIS — F4323 Adjustment disorder with mixed anxiety and depressed mood: Secondary | ICD-10-CM | POA: Diagnosis not present

## 2022-11-21 DIAGNOSIS — F331 Major depressive disorder, recurrent, moderate: Secondary | ICD-10-CM | POA: Diagnosis not present

## 2022-11-21 DIAGNOSIS — F411 Generalized anxiety disorder: Secondary | ICD-10-CM | POA: Diagnosis not present

## 2022-11-22 DIAGNOSIS — F4323 Adjustment disorder with mixed anxiety and depressed mood: Secondary | ICD-10-CM | POA: Diagnosis not present

## 2022-11-29 ENCOUNTER — Encounter: Payer: Self-pay | Admitting: Bariatrics

## 2022-11-29 ENCOUNTER — Ambulatory Visit: Payer: BC Managed Care – PPO | Admitting: Bariatrics

## 2022-11-29 VITALS — BP 114/77 | HR 85 | Temp 98.0°F | Ht 68.0 in | Wt 210.0 lb

## 2022-11-29 DIAGNOSIS — R7303 Prediabetes: Secondary | ICD-10-CM | POA: Diagnosis not present

## 2022-11-29 DIAGNOSIS — E7800 Pure hypercholesterolemia, unspecified: Secondary | ICD-10-CM | POA: Diagnosis not present

## 2022-11-29 DIAGNOSIS — Z6831 Body mass index (BMI) 31.0-31.9, adult: Secondary | ICD-10-CM

## 2022-11-29 DIAGNOSIS — E669 Obesity, unspecified: Secondary | ICD-10-CM

## 2022-11-29 DIAGNOSIS — E559 Vitamin D deficiency, unspecified: Secondary | ICD-10-CM | POA: Diagnosis not present

## 2022-11-29 NOTE — Progress Notes (Signed)
   WEIGHT SUMMARY AND BIOMETRICS  Weight Lost Since Last Visit: 0  Weight Gained Since Last Visit: 0   Vitals Temp: 98 F (36.7 C) BP: 114/77 Pulse Rate: 85 SpO2: 97 %   Anthropometric Measurements Height: 5\' 8"  (1.727 m) Weight: 210 lb (95.3 kg) BMI (Calculated): 31.94 Weight at Last Visit: 210lb Weight Lost Since Last Visit: 0 Weight Gained Since Last Visit: 0 Starting Weight: 225lb Total Weight Loss (lbs): 15 lb (6.804 kg)   Body Composition  Body Fat %: 38.6 % Fat Mass (lbs): 81 lbs Muscle Mass (lbs): 122.4 lbs Total Body Water (lbs): 83.2 lbs Visceral Fat Rating : 9   Other Clinical Data Fasting: no Labs: no Today's Visit #: 8 Starting Date: 07/03/22    OBESITY Latiffany is here to discuss her progress with her obesity treatment plan along with follow-up of her obesity related diagnoses.     Nutrition Plan: the Category 2 plan - 75% adherence.  Current exercise: none  Interim History:  Her weight remains the same.  Eating all of the food on the plan., Protein intake is as prescribed, Is not skipping meals, and Water intake is inadequate.  Hunger is moderately controlled.  Cravings are poorly controlled.  Assessment/Plan:   Prediabetes Last A1c was 5.8  Medication(s): none  Lab Results  Component Value Date   HGBA1C 5.8 (H) 07/03/2022   Lab Results  Component Value Date   INSULIN 10.3 07/03/2022    Plan: Will work on the plan and exercise.  Consider both aerobic and resistance training.  Will keep protein, water, and fiber intake high.  Will minimize unhealthy snacks.   Vitamin D Deficiency Vitamin D is not at goal of 50.  Most recent vitamin D level was 35.4. She is on  prescription ergocalciferol 50,000 IU weekly. Lab Results  Component Value Date   VD25OH 35.4 07/03/2022    Plan: Continue prescription vitamin D 50,000 IU weekly.   Elevated cholesterol:   Taking fish oil and will continue.   Plan: Will do labs.    Labs done today (CMP, Lipids, HgbA1c, insulin, vitamin D).    Generalized Obesity: Current BMI BMI (Calculated): 31.94   She will get back on plan. She will eat less carbohydrates.  Will avoid any carbohydrates   Mickie is currently in the action stage of change. As such, her goal is to continue with weight loss efforts.  She has agreed to the Category 2 plan.  Exercise goals: Adults should also include muscle-strengthening activities that involve all major muscle groups on 2 or more days a week.  Behavioral modification strategies: increasing lean protein intake, no meal skipping, meal planning , increase water intake, better snacking choices, and planning for success.  Tenleigh has agreed to follow-up with our clinic in 4 weeks.       Objective:   VITALS: Per patient if applicable, see vitals. GENERAL: Alert and in no acute distress. CARDIOPULMONARY: No increased WOB. Speaking in clear sentences.  PSYCH: Pleasant and cooperative. Speech normal rate and rhythm. Affect is appropriate. Insight and judgement are appropriate. Attention is focused, linear, and appropriate.  NEURO: Oriented as arrived to appointment on time with no prompting.   Attestation Statements:    This was prepared with the assistance of Engineer, civil (consulting).  Occasional wrong-word or sound-a-like substitutions may have occurred due to the inherent limitations of voice recognition software.   Corinna Capra, DO

## 2022-11-30 LAB — COMPREHENSIVE METABOLIC PANEL
ALT: 16 IU/L (ref 0–32)
AST: 16 IU/L (ref 0–40)
Albumin: 4.6 g/dL (ref 3.9–4.9)
Alkaline Phosphatase: 65 IU/L (ref 44–121)
BUN/Creatinine Ratio: 16 (ref 9–23)
BUN: 12 mg/dL (ref 6–24)
Bilirubin Total: 0.3 mg/dL (ref 0.0–1.2)
CO2: 22 mmol/L (ref 20–29)
Calcium: 9.1 mg/dL (ref 8.7–10.2)
Chloride: 103 mmol/L (ref 96–106)
Creatinine, Ser: 0.76 mg/dL (ref 0.57–1.00)
Globulin, Total: 2.4 g/dL (ref 1.5–4.5)
Glucose: 96 mg/dL (ref 70–99)
Potassium: 4.8 mmol/L (ref 3.5–5.2)
Sodium: 139 mmol/L (ref 134–144)
Total Protein: 7 g/dL (ref 6.0–8.5)
eGFR: 97 mL/min/{1.73_m2} (ref >59)

## 2022-11-30 LAB — HEMOGLOBIN A1C
Est. average glucose Bld gHb Est-mCnc: 108 mg/dL
Hgb A1c MFr Bld: 5.4 % (ref 4.8–5.6)

## 2022-11-30 LAB — VITAMIN D 25 HYDROXY (VIT D DEFICIENCY, FRACTURES): Vit D, 25-Hydroxy: 62.9 ng/mL (ref 30.0–100.0)

## 2022-11-30 LAB — LIPID PANEL WITH LDL/HDL RATIO
Cholesterol, Total: 208 mg/dL — ABNORMAL HIGH (ref 100–199)
HDL: 71 mg/dL (ref >39)
LDL Chol Calc (NIH): 117 mg/dL — ABNORMAL HIGH (ref 0–99)
LDL/HDL Ratio: 1.6 ratio (ref 0.0–3.2)
Triglycerides: 112 mg/dL (ref 0–149)
VLDL Cholesterol Cal: 20 mg/dL (ref 5–40)

## 2022-11-30 LAB — INSULIN, RANDOM: INSULIN: 11.4 u[IU]/mL (ref 2.6–24.9)

## 2022-12-05 DIAGNOSIS — F4323 Adjustment disorder with mixed anxiety and depressed mood: Secondary | ICD-10-CM | POA: Diagnosis not present

## 2022-12-11 DIAGNOSIS — F4323 Adjustment disorder with mixed anxiety and depressed mood: Secondary | ICD-10-CM | POA: Diagnosis not present

## 2022-12-20 DIAGNOSIS — F4323 Adjustment disorder with mixed anxiety and depressed mood: Secondary | ICD-10-CM | POA: Diagnosis not present

## 2023-01-01 DIAGNOSIS — F4323 Adjustment disorder with mixed anxiety and depressed mood: Secondary | ICD-10-CM | POA: Diagnosis not present

## 2023-01-03 ENCOUNTER — Ambulatory Visit: Payer: BC Managed Care – PPO | Admitting: Bariatrics

## 2023-01-03 ENCOUNTER — Encounter: Payer: Self-pay | Admitting: Bariatrics

## 2023-01-03 VITALS — BP 119/83 | HR 87 | Temp 98.2°F | Ht 68.0 in | Wt 214.0 lb

## 2023-01-03 DIAGNOSIS — E559 Vitamin D deficiency, unspecified: Secondary | ICD-10-CM | POA: Diagnosis not present

## 2023-01-03 DIAGNOSIS — E669 Obesity, unspecified: Secondary | ICD-10-CM

## 2023-01-03 DIAGNOSIS — Z6832 Body mass index (BMI) 32.0-32.9, adult: Secondary | ICD-10-CM | POA: Diagnosis not present

## 2023-01-03 DIAGNOSIS — R7303 Prediabetes: Secondary | ICD-10-CM

## 2023-01-03 NOTE — Progress Notes (Addendum)
WEIGHT SUMMARY AND BIOMETRICS  Weight Lost Since Last Visit: 0lb  Weight Gained Since Last Visit: 4lb   Vitals Temp: 98.2 F (36.8 C) BP: 119/83 Pulse Rate: 87 SpO2: 97 %   Anthropometric Measurements Height: 5\' 8"  (1.727 m) Weight: 214 lb (97.1 kg) BMI (Calculated): 32.55 Weight at Last Visit: 210lb Weight Lost Since Last Visit: 0lb Weight Gained Since Last Visit: 4lb Starting Weight: 225lb Total Weight Loss (lbs): 11 lb (4.99 kg)   Body Composition  Body Fat %: 39.9 % Fat Mass (lbs): 85.6 lbs Muscle Mass (lbs): 122.4 lbs Total Body Water (lbs): 83.6 lbs Visceral Fat Rating : 9   Other Clinical Data Fasting: No Labs: No Today's Visit #: 9 Starting Date: 07/03/22    OBESITY Kylie Dickerson is here to discuss her progress with her obesity treatment plan along with follow-up of her obesity related diagnoses.     Nutrition Plan: the Category 2 plan - 75% adherence.  Current exercise: none  Interim History:  She is up 4 lbs since her last visit. She has had some celebrations since her last visit. She is more hungry. She is getting some fiber.  Eating all of the food on the plan., Is not skipping meals, and Water intake is adequate.  Pharmacotherapy:  Hunger is moderately controlled.  Cravings are moderately controlled.    Assessment/Plan:   Prediabetes Last A1c was 5.4. Her A1c was previously 5.8.   Lab Results  Component Value Date   HGBA1C 5.4 11/29/2022   HGBA1C 5.8 (H) 07/03/2022   Lab Results  Component Value Date   INSULIN 11.4 11/29/2022   INSULIN 10.3 07/03/2022    Plan:  Will minimize all refined carbohydrates both sweets and starches.  Will work on the plan and exercise.  Consider both aerobic and resistance training.  Will keep protein, water, and fiber intake high.  Increase Polyunsaturated and Monounsaturated fats to increase satiety and encourage weight loss.  Aim for 7 to 9 hours of sleep nightly.  Will continue  medications.   Vitamin D Deficiency Vitamin D is at goal of 50.  Most recent vitamin D level was 62.9. She is on  prescription ergocalciferol 50,000 IU weekly. Lab Results  Component Value Date   VD25OH 62.9 11/29/2022   VD25OH 35.4 07/03/2022   Labs reviewed today (CMP, Lipids, HgbA1c, insulin).   Plan: Continue prescription vitamin D 50,000 IU weekly.   Generalized Obesity: Current BMI BMI (Calculated): 32.55   Kylie Dickerson is currently in the action stage of change. As such, her goal is to continue with weight loss efforts.  She has agreed to the Category 2 plan.  Exercise goals: All adults should avoid inactivity. Some physical activity is better than none, and adults who participate in any amount of physical activity gain some health benefits.  Behavioral modification strategies: increasing lean protein intake, decreasing simple carbohydrates , no meal skipping, increase water intake, better snacking choices, planning for success, increasing vegetables, increasing fiber rich foods, avoiding temptations, and keep healthy foods in the home.  Kylie Dickerson has agreed to follow-up with our clinic in 4 weeks.       Objective:   VITALS: Per patient if applicable, see vitals. GENERAL: Alert and in no acute distress. CARDIOPULMONARY: No increased WOB. Speaking in clear sentences.  PSYCH: Pleasant and cooperative. Speech normal rate and rhythm. Affect is appropriate. Insight and judgement are appropriate. Attention is focused, linear, and appropriate.  NEURO: Oriented as arrived to appointment on time with no prompting.  Attestation Statements:   This was prepared with the assistance of Engineer, civil (consulting).  Occasional wrong-word or sound-a-like substitutions may have occurred due to the inherent limitations of voice recognition software.   Corinna Capra, DO

## 2023-01-12 DIAGNOSIS — F4323 Adjustment disorder with mixed anxiety and depressed mood: Secondary | ICD-10-CM | POA: Diagnosis not present

## 2023-01-24 DIAGNOSIS — F4323 Adjustment disorder with mixed anxiety and depressed mood: Secondary | ICD-10-CM | POA: Diagnosis not present

## 2023-02-05 ENCOUNTER — Ambulatory Visit: Payer: BC Managed Care – PPO | Admitting: Bariatrics

## 2023-02-05 ENCOUNTER — Encounter: Payer: Self-pay | Admitting: Bariatrics

## 2023-02-05 VITALS — BP 125/78 | HR 70 | Temp 97.9°F | Ht 68.0 in | Wt 213.0 lb

## 2023-02-05 DIAGNOSIS — E669 Obesity, unspecified: Secondary | ICD-10-CM | POA: Diagnosis not present

## 2023-02-05 DIAGNOSIS — R7303 Prediabetes: Secondary | ICD-10-CM | POA: Diagnosis not present

## 2023-02-05 DIAGNOSIS — Z6832 Body mass index (BMI) 32.0-32.9, adult: Secondary | ICD-10-CM

## 2023-02-05 DIAGNOSIS — F4323 Adjustment disorder with mixed anxiety and depressed mood: Secondary | ICD-10-CM | POA: Diagnosis not present

## 2023-02-05 NOTE — Progress Notes (Signed)
   WEIGHT SUMMARY AND BIOMETRICS  Weight Lost Since Last Visit: 1lb  Vitals Temp: 97.9 F (36.6 C) BP: 125/78 Pulse Rate: 70 SpO2: 98 %   Anthropometric Measurements Height: 5\' 8"  (1.727 m) Weight: 213 lb (96.6 kg) BMI (Calculated): 32.39 Weight at Last Visit: 214lb Weight Lost Since Last Visit: 1lb Weight Gained Since Last Visit: 0 Starting Weight: 225lb Total Weight Loss (lbs): 12 lb (5.443 kg)   Body Composition  Body Fat %: 38.8 % Fat Mass (lbs): 82.8 lbs Muscle Mass (lbs): 124.2 lbs Total Body Water (lbs): 85.6 lbs Visceral Fat Rating : 9   Other Clinical Data Fasting: no Labs: no Today's Visit #: 10 Starting Date: 07/03/22    OBESITY Benny is here to discuss her progress with her obesity treatment plan along with follow-up of her obesity related diagnoses.     Nutrition Plan: the Category 2 plan - 60% adherence.  Current exercise: none  Interim History:  She is down 1 lb since her last visit. She is doing weekly meal plans. She is ready to recommit.     Hunger is moderately controlled.  Cravings are moderately controlled.  Assessment/Plan:   Prediabetes Last A1c was 5.4  Medication(s): none  Lab Results  Component Value Date   HGBA1C 5.4 11/29/2022   HGBA1C 5.8 (H) 07/03/2022   Lab Results  Component Value Date   INSULIN 11.4 11/29/2022   INSULIN 10.3 07/03/2022    Plan:  Will minimize all refined carbohydrates both sweets and starches.  Will work on the plan and exercise.  Consider both aerobic and resistance training.  Will keep protein, water, and fiber intake high.  Increase Polyunsaturated and Monounsaturated fats to increase satiety and encourage weight loss.  Aim for 7 to 9 hours of sleep nightly.     Generalized Obesity: Current BMI BMI (Calculated): 32.39    Syndi is currently in the action stage of change. As such, her goal is to continue with weight loss efforts.  She has agreed to the Category 2  plan.  Exercise goals: All adults should avoid inactivity. Some physical activity is better than none, and adults who participate in any amount of physical activity gain some health benefits.  Behavioral modification strategies: increasing lean protein intake, decreasing simple carbohydrates , no meal skipping, increase water intake, better snacking choices, planning for success, avoiding temptations, keep healthy foods in the home, weigh protein portions, and mindful eating.  Estera has agreed to follow-up with our clinic in 4 weeks.      Objective:   VITALS: Per patient if applicable, see vitals. GENERAL: Alert and in no acute distress. CARDIOPULMONARY: No increased WOB. Speaking in clear sentences.  PSYCH: Pleasant and cooperative. Speech normal rate and rhythm. Affect is appropriate. Insight and judgement are appropriate. Attention is focused, linear, and appropriate.  NEURO: Oriented as arrived to appointment on time with no prompting.   Attestation Statements:   This was prepared with the assistance of Engineer, civil (consulting).  Occasional wrong-word or sound-a-like substitutions may have occurred due to the inherent limitations of voice recognition software.  Corinna Capra, DO

## 2023-02-20 DIAGNOSIS — F4323 Adjustment disorder with mixed anxiety and depressed mood: Secondary | ICD-10-CM | POA: Diagnosis not present

## 2023-02-20 DIAGNOSIS — F331 Major depressive disorder, recurrent, moderate: Secondary | ICD-10-CM | POA: Diagnosis not present

## 2023-02-20 DIAGNOSIS — F411 Generalized anxiety disorder: Secondary | ICD-10-CM | POA: Diagnosis not present

## 2023-03-08 ENCOUNTER — Ambulatory Visit: Payer: BC Managed Care – PPO | Admitting: Bariatrics

## 2023-03-15 DIAGNOSIS — F4323 Adjustment disorder with mixed anxiety and depressed mood: Secondary | ICD-10-CM | POA: Diagnosis not present

## 2023-03-29 ENCOUNTER — Ambulatory Visit: Payer: BC Managed Care – PPO | Admitting: Nurse Practitioner

## 2023-03-29 ENCOUNTER — Encounter: Payer: Self-pay | Admitting: Nurse Practitioner

## 2023-03-29 VITALS — BP 120/81 | HR 73 | Temp 98.4°F | Ht 68.0 in | Wt 217.0 lb

## 2023-03-29 DIAGNOSIS — E669 Obesity, unspecified: Secondary | ICD-10-CM | POA: Diagnosis not present

## 2023-03-29 DIAGNOSIS — F411 Generalized anxiety disorder: Secondary | ICD-10-CM | POA: Diagnosis not present

## 2023-03-29 DIAGNOSIS — E7800 Pure hypercholesterolemia, unspecified: Secondary | ICD-10-CM | POA: Diagnosis not present

## 2023-03-29 DIAGNOSIS — R7303 Prediabetes: Secondary | ICD-10-CM | POA: Diagnosis not present

## 2023-03-29 DIAGNOSIS — F331 Major depressive disorder, recurrent, moderate: Secondary | ICD-10-CM | POA: Diagnosis not present

## 2023-03-29 DIAGNOSIS — Z6832 Body mass index (BMI) 32.0-32.9, adult: Secondary | ICD-10-CM | POA: Diagnosis not present

## 2023-03-29 NOTE — Progress Notes (Signed)
Office: [phone]  /  Fax: [phone]  WEIGHT SUMMARY AND BIOMETRICS  Weight Lost Since Last Visit: 0lb  Weight Gained Since Last Visit: 4lb   Vitals Temp: 98.4 F (36.9 C) BP: 120/81 Pulse Rate: 73 SpO2: 98 %   Anthropometric Measurements Height: 5\' 8"  (1.727 m) Weight: 217 lb (98.4 kg) BMI (Calculated): 33 Weight at Last Visit: 213lb Weight Lost Since Last Visit: 0lb Weight Gained Since Last Visit: 4lb Starting Weight: 225lb Total Weight Loss (lbs): 8 lb (3.629 kg)   Body Composition  Body Fat %: 40.5 % Fat Mass (lbs): 88.2 lbs Muscle Mass (lbs): 123 lbs Total Body Water (lbs): 88.2 lbs Visceral Fat Rating : 10   Other Clinical Data Fasting: No Labs: No Today's Visit #: 11 Starting Date: 07/03/22     HPI  Chief Complaint: OBESITY  Kylie Dickerson is here to discuss her progress with her obesity treatment plan. She is on the the Category 2 Plan and states she is following her eating plan approximately 80 % of the time. She states she is exercising 0 minutes 0 days per week.   Interval History:  Since last office visit she has gained 4 pounds.  She is not skipping meals.  She is eating 2-3 snacks per day.  Notes polyphagia and cravings.  She is drinking water and beer on the weekends.    BF: protein cereal with non fat milk Snack:  yogurt with brownie crisp Lunch:  spinach wrap with a protein and cheese and cliff bars or cheese it packs Snack:  none Dinners:  protein with a vegetable   Pharmacotherapy for weight loss: She is not currently taking medications  for medical weight loss.  Denies side effects.     Previous pharmacotherapy for medical weight loss:  none   Bariatric surgery:  Patient has not had bariatric surgery.    Hyperlipidemia Medication(s): none.   Lab Results  Component Value Date   CHOL 208 (H) 11/29/2022   HDL 71 11/29/2022   LDLCALC 117 (H) 11/29/2022   TRIG 112 11/29/2022   Lab Results  Component Value Date   ALT 16  11/29/2022   AST 16 11/29/2022   ALKPHOS 65 11/29/2022   BILITOT 0.3 11/29/2022   The 10-year ASCVD risk score (Arnett DK, et al., 2019) is: 0.7%   Values used to calculate the score:     Age: 47 years     Sex: Female     Is Non-Hispanic African American: No     Diabetic: No     Tobacco smoker: No     Systolic Blood Pressure: 120 mmHg     Is BP treated: No     HDL Cholesterol: 71 mg/dL     Total Cholesterol: 208 mg/dL    Prediabetes Last F6O was 5.4  Medication(s): none Polyphagia:Yes FH:  multiple family members with DMT2  Lab Results  Component Value Date   HGBA1C 5.4 11/29/2022   HGBA1C 5.8 (H) 07/03/2022   Lab Results  Component Value Date   INSULIN 11.4 11/29/2022   INSULIN 10.3 07/03/2022     PHYSICAL EXAM:  Blood pressure 120/81, pulse 73, temperature 98.4 F (36.9 C), height 5\' 8"  (1.727 m), weight 217 lb (98.4 kg), last menstrual period 02/21/2023, SpO2 98%. Body mass index is 32.99 kg/m.  General: She is overweight, cooperative, alert, well developed, and in no acute distress. PSYCH: Has normal mood, affect and thought process.   Extremities: No edema.  Neurologic: No gross sensory or  motor deficits. No tremors or fasciculations noted.    DIAGNOSTIC DATA REVIEWED:  BMET    Component Value Date/Time   NA 139 11/29/2022 0758   K 4.8 11/29/2022 0758   CL 103 11/29/2022 0758   CO2 22 11/29/2022 0758   GLUCOSE 96 11/29/2022 0758   BUN 12 11/29/2022 0758   CREATININE 0.76 11/29/2022 0758   CALCIUM 9.1 11/29/2022 0758   Lab Results  Component Value Date   HGBA1C 5.4 11/29/2022   HGBA1C 5.8 (H) 07/03/2022   Lab Results  Component Value Date   INSULIN 11.4 11/29/2022   INSULIN 10.3 07/03/2022   Lab Results  Component Value Date   TSH 2.57 04/04/2022   CBC    Component Value Date/Time   WBC 6.9 04/04/2022 0000   RBC 4.45 04/04/2022 0000   HGB 14.2 04/04/2022 0000   HCT 41 04/04/2022 0000   PLT 287 04/04/2022 0000   Iron Studies No  results found for: "IRON", "TIBC", "FERRITIN", "IRONPCTSAT" Lipid Panel     Component Value Date/Time   CHOL 208 (H) 11/29/2022 0758   TRIG 112 11/29/2022 0758   HDL 71 11/29/2022 0758   LDLCALC 117 (H) 11/29/2022 0758   Hepatic Function Panel     Component Value Date/Time   PROT 7.0 11/29/2022 0758   ALBUMIN 4.6 11/29/2022 0758   AST 16 11/29/2022 0758   ALT 16 11/29/2022 0758   ALKPHOS 65 11/29/2022 0758   BILITOT 0.3 11/29/2022 0758      Component Value Date/Time   TSH 2.57 04/04/2022 0000   Nutritional Lab Results  Component Value Date   VD25OH 62.9 11/29/2022   VD25OH 35.4 07/03/2022     ASSESSMENT AND PLAN  TREATMENT PLAN FOR OBESITY:  Recommended Dietary Goals  Kylie Dickerson is currently in the action stage of change. As such, her goal is to continue weight management plan. She has agreed to the Category 2 Plan.  Will track and will review calories/macros at next visit.   Behavioral Intervention  We discussed the following Behavioral Modification Strategies today: increasing lean protein intake to established goals, work on meal planning and preparation, work on tracking and journaling calories using tracking application, keeping healthy foods at home, and continue to work on maintaining a reduced calorie state, getting the recommended amount of protein, incorporating whole foods, making healthy choices, staying well hydrated and practicing mindfulness when eating..  Additional resources provided today: NA  Recommended Physical Activity Goals  Kylie Dickerson has been advised to work up to 150 minutes of moderate intensity aerobic activity a week and strengthening exercises 2-3 times per week for cardiovascular health, weight loss maintenance and preservation of muscle mass.   She has agreed to Think about enjoyable ways to increase daily physical activity and overcoming barriers to exercise, Increase physical activity in their day and reduce sedentary time (increase  NEAT)., and Work on scheduling and tracking physical activity.    Pharmacotherapy We discussed various medication options to help Kylie Dickerson with her weight loss efforts and we both agreed to consider her options.  Information given on Qsymia and Metformin. Will call insurance to discuss coverage for GLP-1s  Avoid Contrave due to side effects to Wellbutrin in the past  ASSOCIATED CONDITIONS ADDRESSED TODAY  Action/Plan  Elevated cholesterol Cardiovascular risk and specific lipid/LDL goals reviewed.  We discussed several lifestyle modifications today and Kylie Dickerson will continue to work on diet, exercise and weight loss efforts. Orders and follow up as documented in patient record.   Counseling  Intensive lifestyle modifications are the first line treatment for this issue. Dietary changes: Increase soluble fiber. Decrease simple carbohydrates. Exercise changes: Moderate to vigorous-intensity aerobic activity 150 minutes per week if tolerated. Lipid-lowering medications: see documented in medical record.   Prediabetes Kylie Dickerson will continue to work on weight loss, exercise, and decreasing simple carbohydrates to help decrease the risk of diabetes.   Discussed Metformin   Generalized obesity  BMI 32.0-32.9,adult         Return in about 3 weeks (around 04/19/2023).Marland Kitchen She was informed of the importance of frequent follow up visits to maximize her success with intensive lifestyle modifications for her multiple health conditions.   ATTESTASTION STATEMENTS:  Reviewed by clinician on day of visit: allergies, medications, problem list, medical history, surgical history, family history, social history, and previous encounter notes.   Time spent on visit including pre-visit chart review and post-visit care and charting was 30 minutes.    Theodis Sato. Hazelyn Kallen FNP-C

## 2023-04-04 ENCOUNTER — Encounter: Payer: Self-pay | Admitting: Nurse Practitioner

## 2023-04-09 ENCOUNTER — Other Ambulatory Visit: Payer: Self-pay | Admitting: Nurse Practitioner

## 2023-04-09 ENCOUNTER — Ambulatory Visit: Payer: BC Managed Care – PPO | Admitting: Nurse Practitioner

## 2023-04-09 ENCOUNTER — Encounter: Payer: Self-pay | Admitting: Nurse Practitioner

## 2023-04-09 VITALS — BP 123/82 | HR 88 | Temp 98.2°F | Ht 68.0 in | Wt 219.0 lb

## 2023-04-09 DIAGNOSIS — E669 Obesity, unspecified: Secondary | ICD-10-CM

## 2023-04-09 DIAGNOSIS — Z6833 Body mass index (BMI) 33.0-33.9, adult: Secondary | ICD-10-CM

## 2023-04-09 DIAGNOSIS — R7303 Prediabetes: Secondary | ICD-10-CM | POA: Diagnosis not present

## 2023-04-09 DIAGNOSIS — R632 Polyphagia: Secondary | ICD-10-CM

## 2023-04-09 MED ORDER — LOMAIRA 8 MG PO TABS: 1.0000 | ORAL_TABLET | Freq: Every day | ORAL | 0 refills | Status: DC

## 2023-04-09 MED ORDER — TOPIRAMATE 50 MG PO TABS: ORAL_TABLET | ORAL | 0 refills | Status: DC

## 2023-04-09 NOTE — Progress Notes (Signed)
Office: [phone]  /  Fax: [phone]  WEIGHT SUMMARY AND BIOMETRICS  Weight Lost Since Last Visit: 0lb  Weight Gained Since Last Visit: 2lb   Vitals Temp: 98.2 F (36.8 C) BP: 123/82 Pulse Rate: 88 SpO2: 98 %   Anthropometric Measurements Height: 5\' 8"  (1.727 m) Weight: 219 lb (99.3 kg) BMI (Calculated): 33.31 Weight at Last Visit: 217lb Weight Lost Since Last Visit: 0lb Weight Gained Since Last Visit: 2lb Starting Weight: 225lb Total Weight Loss (lbs): 6 lb (2.722 kg)   Body Composition  Body Fat %: 41.7 % Fat Mass (lbs): 91.4 lbs Muscle Mass (lbs): 121.4 lbs Total Body Water (lbs): 90 lbs Visceral Fat Rating : 10   Other Clinical Data Fasting: No Labs: No Today's Visit #: 12 Starting Date: 07/03/22     HPI  Chief Complaint: OBESITY  Kylie Dickerson is here to discuss her progress with her obesity treatment plan. She is on the the Category 2 Plan and states she is following her eating plan approximately 80 % of the time. She states she is exercising 0 minutes 0 days per week.   Interval History:  Since last office visit she has gained 2 pounds.     Pharmacotherapy for weight loss: She is not currently taking medications  for medical weight loss.    Previous pharmacotherapy for medical weight loss:  none  Bariatric surgery:  Patient has not had bariatric surgery.      PHYSICAL EXAM:  Blood pressure 123/82, pulse 88, temperature 98.2 F (36.8 C), height 5\' 8"  (1.727 m), weight 219 lb (99.3 kg), last menstrual period 04/09/2023, SpO2 98%. Body mass index is 33.3 kg/m.  General: She is overweight, cooperative, alert, well developed, and in no acute distress. PSYCH: Has normal mood, affect and thought process.   Extremities: No edema.  Neurologic: No gross sensory or motor deficits. No tremors or fasciculations noted.    DIAGNOSTIC DATA REVIEWED:  BMET    Component Value Date/Time   NA 139 11/29/2022 0758   K 4.8 11/29/2022 0758   CL 103  11/29/2022 0758   CO2 22 11/29/2022 0758   GLUCOSE 96 11/29/2022 0758   BUN 12 11/29/2022 0758   CREATININE 0.76 11/29/2022 0758   CALCIUM 9.1 11/29/2022 0758   Lab Results  Component Value Date   HGBA1C 5.4 11/29/2022   HGBA1C 5.8 (H) 07/03/2022   Lab Results  Component Value Date   INSULIN 11.4 11/29/2022   INSULIN 10.3 07/03/2022   Lab Results  Component Value Date   TSH 2.57 04/04/2022   CBC    Component Value Date/Time   WBC 6.9 04/04/2022 0000   RBC 4.45 04/04/2022 0000   HGB 14.2 04/04/2022 0000   HCT 41 04/04/2022 0000   PLT 287 04/04/2022 0000   Iron Studies No results found for: "IRON", "TIBC", "FERRITIN", "IRONPCTSAT" Lipid Panel     Component Value Date/Time   CHOL 208 (H) 11/29/2022 0758   TRIG 112 11/29/2022 0758   HDL 71 11/29/2022 0758   LDLCALC 117 (H) 11/29/2022 0758   Hepatic Function Panel     Component Value Date/Time   PROT 7.0 11/29/2022 0758   ALBUMIN 4.6 11/29/2022 0758   AST 16 11/29/2022 0758   ALT 16 11/29/2022 0758   ALKPHOS 65 11/29/2022 0758   BILITOT 0.3 11/29/2022 0758      Component Value Date/Time   TSH 2.57 04/04/2022 0000   Nutritional Lab Results  Component Value Date   VD25OH 62.9 11/29/2022  VD25OH 35.4 07/03/2022     ASSESSMENT AND PLAN  TREATMENT PLAN FOR OBESITY:  Recommended Dietary Goals  Milayah is currently in the action stage of change. As such, her goal is to continue weight management plan. She has agreed to the Category 2 Plan.  Behavioral Intervention  We discussed the following Behavioral Modification Strategies today: work on tracking and journaling calories using tracking application and continue to work on maintaining a reduced calorie state, getting the recommended amount of protein, incorporating whole foods, making healthy choices, staying well hydrated and practicing mindfulness when eating..  Additional resources provided today: NA  Recommended Physical Activity Goals  Danieliz  has been advised to work up to 150 minutes of moderate intensity aerobic activity a week and strengthening exercises 2-3 times per week for cardiovascular health, weight loss maintenance and preservation of muscle mass.   She has agreed to Think about enjoyable ways to increase daily physical activity and overcoming barriers to exercise, Increase physical activity in their day and reduce sedentary time (increase NEAT)., and Work on scheduling and tracking physical activity.    Pharmacotherapy We discussed various medication options to help Kassydi with her weight loss efforts and we both agreed to start Lomaira 8mg  once daily and Topamax 50mg .  Side effects discussed.  Has taken Topamax in the past and did well.  Discussed with psychiatry prior to starting.  PDMP reviewed.  Discussed with Dr. Manson Passey. She is married in the same sex relationship  Avoid Contrave due to side effects to Wellbutrin in the past   ASSOCIATED CONDITIONS ADDRESSED TODAY  Action/Plan  Prediabetes Discussed Metformin in the past.    Will continue to monitor.    Generalized obesity -     Lomaira; Take 1 tablet (8 mg total) by mouth daily.  Dispense: 30 tablet; Refill: 0 -     Topiramate; Take 1/2 tablet po daily for one week and then increase to a full pill  Dispense: 30 tablet; Refill: 0  BMI 33.0-33.9,adult  Polyphagia -     Lomaira; Take 1 tablet (8 mg total) by mouth daily.  Dispense: 30 tablet; Refill: 0 -     Topiramate; Take 1/2 tablet po daily for one week and then increase to a full pill  Dispense: 30 tablet; Refill: 0         Return in about 4 weeks (around 05/07/2023).Marland Kitchen She was informed of the importance of frequent follow up visits to maximize her success with intensive lifestyle modifications for her multiple health conditions.   ATTESTASTION STATEMENTS:  Reviewed by clinician on day of visit: allergies, medications, problem list, medical history, surgical history, family history, social  history, and previous encounter notes.    Theodis Sato. Etsuko Dierolf FNP-C

## 2023-04-12 DIAGNOSIS — F4323 Adjustment disorder with mixed anxiety and depressed mood: Secondary | ICD-10-CM | POA: Diagnosis not present

## 2023-04-18 ENCOUNTER — Ambulatory Visit: Payer: BC Managed Care – PPO | Admitting: Nurse Practitioner

## 2023-04-30 DIAGNOSIS — M79672 Pain in left foot: Secondary | ICD-10-CM | POA: Diagnosis not present

## 2023-05-02 ENCOUNTER — Encounter: Payer: Self-pay | Admitting: Nurse Practitioner

## 2023-05-02 ENCOUNTER — Ambulatory Visit: Payer: BC Managed Care – PPO | Admitting: Nurse Practitioner

## 2023-05-02 VITALS — BP 132/85 | HR 70 | Temp 98.1°F | Ht 68.0 in | Wt 215.0 lb

## 2023-05-02 DIAGNOSIS — E669 Obesity, unspecified: Secondary | ICD-10-CM | POA: Diagnosis not present

## 2023-05-02 DIAGNOSIS — R632 Polyphagia: Secondary | ICD-10-CM

## 2023-05-02 DIAGNOSIS — F4323 Adjustment disorder with mixed anxiety and depressed mood: Secondary | ICD-10-CM | POA: Diagnosis not present

## 2023-05-02 DIAGNOSIS — Z6832 Body mass index (BMI) 32.0-32.9, adult: Secondary | ICD-10-CM | POA: Diagnosis not present

## 2023-05-02 MED ORDER — LOMAIRA 8 MG PO TABS: 1.0000 | ORAL_TABLET | Freq: Every day | ORAL | 0 refills | Status: DC

## 2023-05-02 NOTE — Progress Notes (Signed)
Office: [phone]  /  Fax: [phone]  WEIGHT SUMMARY AND BIOMETRICS  Weight Lost Since Last Visit: 4lb  Weight Gained Since Last Visit: 0lb   Vitals Temp: 98.1 F (36.7 C) BP: 132/85 Pulse Rate: 70 SpO2: 99 %   Anthropometric Measurements Height: 5\' 8"  (1.727 m) Weight: 215 lb (97.5 kg) BMI (Calculated): 32.7 Weight at Last Visit: 219lb Weight Lost Since Last Visit: 4lb Weight Gained Since Last Visit: 0lb Starting Weight: 225lb Total Weight Loss (lbs): 10 lb (4.536 kg)   Body Composition  Body Fat %: 39.8 % Fat Mass (lbs): 85.6 lbs Muscle Mass (lbs): 122.8 lbs Total Body Water (lbs): 86.4 lbs Visceral Fat Rating : 9   Other Clinical Data Fasting: No Labs: No Today's Visit #: 13 Starting Date: 07/03/22     HPI  Chief Complaint: OBESITY  Kylie Dickerson is here to discuss her progress with her obesity treatment plan. She is on the the Category 2 Plan and states she is following her eating plan approximately 90 % of the time. She states she is exercising 30 minutes 1 days per week.   Interval History:  Since last office visit she has lost 4 pounds.  She has been watching her portion sizes and making healthier choices.  She did well over Thanksgiving.  She eats one snack per day-protein bar. Denies polyphagia or cravings since starting Lomaira and Topamax.  She is seeing a therapist every 2-3 weeks.  She is no longer hiding and eating food as she was in the past. She starting riding a stationary bike since her last visit.    Pharmacotherapy for weight loss: She is taking Lomaira 8mg  daily for medical weight loss and Topamax 50mg  for food impulse control and cravings. Denies side effects.     Previous pharmacotherapy for medical weight loss:  none   Bariatric surgery:  Patient has not had bariatric surgery.       PHYSICAL EXAM:  Blood pressure 132/85, pulse 70, temperature 98.1 F (36.7 C), height 5\' 8"  (1.727 m), weight 215 lb (97.5 kg), last menstrual  period 04/09/2023, SpO2 99%. Body mass index is 32.69 kg/m.  General: She is overweight, cooperative, alert, well developed, and in no acute distress. PSYCH: Has normal mood, affect and thought process.   Extremities: No edema.  Neurologic: No gross sensory or motor deficits. No tremors or fasciculations noted.    DIAGNOSTIC DATA REVIEWED:  BMET    Component Value Date/Time   NA 139 11/29/2022 0758   K 4.8 11/29/2022 0758   CL 103 11/29/2022 0758   CO2 22 11/29/2022 0758   GLUCOSE 96 11/29/2022 0758   BUN 12 11/29/2022 0758   CREATININE 0.76 11/29/2022 0758   CALCIUM 9.1 11/29/2022 0758   Lab Results  Component Value Date   HGBA1C 5.4 11/29/2022   HGBA1C 5.8 (H) 07/03/2022   Lab Results  Component Value Date   INSULIN 11.4 11/29/2022   INSULIN 10.3 07/03/2022   Lab Results  Component Value Date   TSH 2.57 04/04/2022   CBC    Component Value Date/Time   WBC 6.9 04/04/2022 0000   RBC 4.45 04/04/2022 0000   HGB 14.2 04/04/2022 0000   HCT 41 04/04/2022 0000   PLT 287 04/04/2022 0000   Iron Studies No results found for: "IRON", "TIBC", "FERRITIN", "IRONPCTSAT" Lipid Panel     Component Value Date/Time   CHOL 208 (H) 11/29/2022 0758   TRIG 112 11/29/2022 0758   HDL 71 11/29/2022 0758  LDLCALC 117 (H) 11/29/2022 0758   Hepatic Function Panel     Component Value Date/Time   PROT 7.0 11/29/2022 0758   ALBUMIN 4.6 11/29/2022 0758   AST 16 11/29/2022 0758   ALT 16 11/29/2022 0758   ALKPHOS 65 11/29/2022 0758   BILITOT 0.3 11/29/2022 0758      Component Value Date/Time   TSH 2.57 04/04/2022 0000   Nutritional Lab Results  Component Value Date   VD25OH 62.9 11/29/2022   VD25OH 35.4 07/03/2022     ASSESSMENT AND PLAN  TREATMENT PLAN FOR OBESITY:  Recommended Dietary Goals  Kylie Dickerson is currently in the action stage of change. As such, her goal is to continue weight management plan. She has agreed to the Category 2 Plan.  Behavioral  Intervention  We discussed the following Behavioral Modification Strategies today: increasing lean protein intake to established goals, decreasing simple carbohydrates , increasing vegetables, increasing fiber rich foods, increasing water intake , planning for success, celebration eating strategies, and continue to work on maintaining a reduced calorie state, getting the recommended amount of protein, incorporating whole foods, making healthy choices, staying well hydrated and practicing mindfulness when eating..  Additional resources provided today: NA  Recommended Physical Activity Goals  Kylie Dickerson has been advised to work up to 150 minutes of moderate intensity aerobic activity a week and strengthening exercises 2-3 times per week for cardiovascular health, weight loss maintenance and preservation of muscle mass.   She has agreed to Think about enjoyable ways to increase daily physical activity and overcoming barriers to exercise, Increase physical activity in their day and reduce sedentary time (increase NEAT)., Increase the intensity, frequency or duration of strengthening exercises , and Increase the intensity, frequency or duration of aerobic exercises     Pharmacotherapy We discussed various medication options to help Kylie Dickerson with her weight loss efforts and we both agreed to continue Lomaira 8mg  and topamax 50mg .  Side effects discussed.  ASSOCIATED CONDITIONS ADDRESSED TODAY  Action/Plan  Polyphagia -     Lomaira; Take 1 tablet (8 mg total) by mouth daily.  Dispense: 30 tablet; Refill: 0  Generalized obesity -     Lomaira; Take 1 tablet (8 mg total) by mouth daily.  Dispense: 30 tablet; Refill: 0  BMI 32.0-32.9,adult         Return in about 4 weeks (around 05/30/2023).Marland Kitchen She was informed of the importance of frequent follow up visits to maximize her success with intensive lifestyle modifications for her multiple health conditions.   ATTESTASTION STATEMENTS:  Reviewed by  clinician on day of visit: allergies, medications, problem list, medical history, surgical history, family history, social history, and previous encounter notes.     Theodis Sato. Kylie Biggs FNP-C

## 2023-05-14 DIAGNOSIS — M79672 Pain in left foot: Secondary | ICD-10-CM | POA: Diagnosis not present

## 2023-05-25 DIAGNOSIS — F4323 Adjustment disorder with mixed anxiety and depressed mood: Secondary | ICD-10-CM | POA: Diagnosis not present

## 2023-05-31 DIAGNOSIS — R6889 Other general symptoms and signs: Secondary | ICD-10-CM | POA: Diagnosis not present

## 2023-05-31 DIAGNOSIS — M79672 Pain in left foot: Secondary | ICD-10-CM | POA: Diagnosis not present

## 2023-06-06 ENCOUNTER — Ambulatory Visit: Payer: BC Managed Care – PPO | Admitting: Nurse Practitioner

## 2023-06-06 ENCOUNTER — Encounter: Payer: Self-pay | Admitting: Nurse Practitioner

## 2023-06-06 VITALS — BP 128/87 | HR 81 | Temp 98.1°F | Ht 68.0 in | Wt 215.0 lb

## 2023-06-06 DIAGNOSIS — E669 Obesity, unspecified: Secondary | ICD-10-CM | POA: Diagnosis not present

## 2023-06-06 DIAGNOSIS — Z6832 Body mass index (BMI) 32.0-32.9, adult: Secondary | ICD-10-CM

## 2023-06-06 DIAGNOSIS — R632 Polyphagia: Secondary | ICD-10-CM | POA: Diagnosis not present

## 2023-06-06 MED ORDER — LOMAIRA 8 MG PO TABS: ORAL_TABLET | ORAL | 0 refills | Status: DC

## 2023-06-06 NOTE — Progress Notes (Signed)
 Office: [phone]  /  Fax: [phone]  WEIGHT SUMMARY AND BIOMETRICS  Weight Lost Since Last Visit: 0lb  Weight Gained Since Last Visit: 0lb   Vitals Temp: 98.1 F (36.7 C) BP: 128/87 Pulse Rate: 81 SpO2: 99 %   Anthropometric Measurements Height: 5\' 8"  (1.727 m) Weight: 215 lb (97.5 kg) BMI (Calculated): 32.7 Weight at Last Visit: 215lb Weight Lost Since Last Visit: 0lb Weight Gained Since Last Visit: 0lb Starting Weight: 225lb Total Weight Loss (lbs): 10 lb (4.536 kg)   Body Composition  Body Fat %: 40.4 % Fat Mass (lbs): 87 lbs Muscle Mass (lbs): 122 lbs Total Body Water (lbs): 86.8 lbs Visceral Fat Rating : 10   Other Clinical Data Fasting: No Labs: No Today's Visit #: 14 Starting Date: 07/03/22     HPI  Chief Complaint: OBESITY  Kylie Dickerson is here to discuss her progress with her obesity treatment plan. She is on the the Category 2 Plan and states she is following her eating plan approximately 80 % of the time. She states she is exercising 30 minutes 1 days per week.   Interval History:  Since last office visit she has maintained her weight.  She struggled with staying on track over the holidays.  She has now gotten back on track.  She is doing better with snacking especially since all the Christmas candy is out of the house.  She struggles most around Christmas and her birthday.  She is drinking water daily. Denies sugary drinks.  Notes polyphagia and cravings.      No upcoming celebrations or travel.   Pharmacotherapy for weight loss: She is taking Lomaira  8mg  daily for medical weight loss and Topamax  50mg  for food impulse control and cravings. Reports side effects of tingling in her hands.     Previous pharmacotherapy for medical weight loss:  none   Bariatric surgery:  Patient has not had bariatric surgery.         PHYSICAL EXAM:  Blood pressure 128/87, pulse 81, temperature 98.1 F (36.7 C), height 5\' 8"  (1.727 m), weight 215 lb (97.5  kg), last menstrual period 05/13/2023, SpO2 99%. Body mass index is 32.69 kg/m.  General: She is overweight, cooperative, alert, well developed, and in no acute distress. PSYCH: Has normal mood, affect and thought process.   Extremities: No edema.  Neurologic: No gross sensory or motor deficits. No tremors or fasciculations noted.    DIAGNOSTIC DATA REVIEWED:  BMET    Component Value Date/Time   NA 139 11/29/2022 0758   K 4.8 11/29/2022 0758   CL 103 11/29/2022 0758   CO2 22 11/29/2022 0758   GLUCOSE 96 11/29/2022 0758   BUN 12 11/29/2022 0758   CREATININE 0.76 11/29/2022 0758   CALCIUM 9.1 11/29/2022 0758   Lab Results  Component Value Date   HGBA1C 5.4 11/29/2022   HGBA1C 5.8 (H) 07/03/2022   Lab Results  Component Value Date   INSULIN  11.4 11/29/2022   INSULIN  10.3 07/03/2022   Lab Results  Component Value Date   TSH 2.57 04/04/2022   CBC    Component Value Date/Time   WBC 6.9 04/04/2022 0000   RBC 4.45 04/04/2022 0000   HGB 14.2 04/04/2022 0000   HCT 41 04/04/2022 0000   PLT 287 04/04/2022 0000   Iron Studies No results found for: "IRON", "TIBC", "FERRITIN", "IRONPCTSAT" Lipid Panel     Component Value Date/Time   CHOL 208 (H) 11/29/2022 0758   TRIG 112 11/29/2022 0758  HDL 71 11/29/2022 0758   LDLCALC 117 (H) 11/29/2022 0758   Hepatic Function Panel     Component Value Date/Time   PROT 7.0 11/29/2022 0758   ALBUMIN 4.6 11/29/2022 0758   AST 16 11/29/2022 0758   ALT 16 11/29/2022 0758   ALKPHOS 65 11/29/2022 0758   BILITOT 0.3 11/29/2022 0758      Component Value Date/Time   TSH 2.57 04/04/2022 0000   Nutritional Lab Results  Component Value Date   VD25OH 62.9 11/29/2022   VD25OH 35.4 07/03/2022     ASSESSMENT AND PLAN  TREATMENT PLAN FOR OBESITY:  Recommended Dietary Goals  Mayumi is currently in the action stage of change. As such, her goal is to continue weight management plan. She has agreed to the Category 2  Plan.  Behavioral Intervention  We discussed the following Behavioral Modification Strategies today: increasing lean protein intake to established goals, decreasing simple carbohydrates , increasing vegetables, increasing water intake , work on meal planning and preparation, reading food labels , keeping healthy foods at home, planning for success, and continue to work on maintaining a reduced calorie state, getting the recommended amount of protein, incorporating whole foods, making healthy choices, staying well hydrated and practicing mindfulness when eating..  Additional resources provided today: NA  Recommended Physical Activity Goals  Kenzee has been advised to work up to 150 minutes of moderate intensity aerobic activity a week and strengthening exercises 2-3 times per week for cardiovascular health, weight loss maintenance and preservation of muscle mass.   She has agreed to Think about enjoyable ways to increase daily physical activity and overcoming barriers to exercise, Increase physical activity in their day and reduce sedentary time (increase NEAT)., Increase the intensity, frequency or duration of strengthening exercises , and Increase the intensity, frequency or duration of aerobic exercises     Pharmacotherapy We discussed various medication options to help Tajia with her weight loss efforts and we both agreed to increase Lomaira  8mg  BID and continue Topamax  50mg  bedtime.  Side effects discussed.  ASSOCIATED CONDITIONS ADDRESSED TODAY  Action/Plan  Polyphagia -     Lomaira ; Take one po BID  Dispense: 60 tablet; Refill: 0  Generalized obesity -     Lomaira ; Take one po BID  Dispense: 60 tablet; Refill: 0  BMI 32.0-32.9,adult         Return in about 4 weeks (around 07/04/2023).Aaron Aas She was informed of the importance of frequent follow up visits to maximize her success with intensive lifestyle modifications for her multiple health conditions.   ATTESTASTION  STATEMENTS:  Reviewed by clinician on day of visit: allergies, medications, problem list, medical history, surgical history, family history, social history, and previous encounter notes.     Crist Dominion. Bemnet Trovato FNP-C

## 2023-06-11 DIAGNOSIS — M79672 Pain in left foot: Secondary | ICD-10-CM | POA: Diagnosis not present

## 2023-06-15 DIAGNOSIS — F4323 Adjustment disorder with mixed anxiety and depressed mood: Secondary | ICD-10-CM | POA: Diagnosis not present

## 2023-06-28 ENCOUNTER — Encounter: Payer: Self-pay | Admitting: Nurse Practitioner

## 2023-06-28 ENCOUNTER — Ambulatory Visit: Payer: BC Managed Care – PPO | Admitting: Nurse Practitioner

## 2023-06-28 VITALS — BP 122/83 | HR 73 | Temp 98.2°F | Ht 68.0 in | Wt 210.0 lb

## 2023-06-28 DIAGNOSIS — R632 Polyphagia: Secondary | ICD-10-CM

## 2023-06-28 DIAGNOSIS — F331 Major depressive disorder, recurrent, moderate: Secondary | ICD-10-CM | POA: Diagnosis not present

## 2023-06-28 DIAGNOSIS — Z6831 Body mass index (BMI) 31.0-31.9, adult: Secondary | ICD-10-CM

## 2023-06-28 DIAGNOSIS — E669 Obesity, unspecified: Secondary | ICD-10-CM | POA: Diagnosis not present

## 2023-06-28 DIAGNOSIS — F411 Generalized anxiety disorder: Secondary | ICD-10-CM | POA: Diagnosis not present

## 2023-06-28 MED ORDER — LOMAIRA 8 MG PO TABS: ORAL_TABLET | ORAL | 0 refills | Status: DC

## 2023-06-28 MED ORDER — TOPIRAMATE 50 MG PO TABS: ORAL_TABLET | ORAL | 0 refills | Status: DC

## 2023-06-28 NOTE — Progress Notes (Signed)
 Office: [phone]  /  Fax: [phone]  WEIGHT SUMMARY AND BIOMETRICS  Weight Lost Since Last Visit: 5lb  Weight Gained Since Last Visit: 0lb   Vitals Temp: 98.2 F (36.8 C) BP: 122/83 Pulse Rate: 73 SpO2: 99 %   Anthropometric Measurements Height: 5' 8 (1.727 m) Weight: 210 lb (95.3 kg) BMI (Calculated): 31.94 Weight at Last Visit: 215lb Weight Lost Since Last Visit: 5lb Weight Gained Since Last Visit: 0lb Starting Weight: 225lb Total Weight Loss (lbs): 15 lb (6.804 kg)   Body Composition  Body Fat %: 39.6 % Fat Mass (lbs): 83.4 lbs Muscle Mass (lbs): 120.8 lbs Total Body Water (lbs): 84.2 lbs Visceral Fat Rating : 9   Other Clinical Data Fasting: No Labs: No Today's Visit #: 15 Starting Date: 07/03/22     HPI  Chief Complaint: OBESITY  Dynastie is here to discuss her progress with her obesity treatment plan. She is on the the Category 2 Plan and states she is following her eating plan approximately 90 % of the time. She states she is exercising 0 minutes 0 days per week.   Interval History:  Since last office visit she has lost 5 pounds. She has overall done well with weight loss.  She is under stress at home. Her dog passed away last week.  She has decreased her alcohol  intake.  She is drinking water daily. Denies sugary drinks.   Pharmacotherapy for weight loss: She is taking Lomaira  8mg  daily (hasn't been taking BID) for medical weight loss/polyphagia and Topamax  50mg  for food impulse control and cravings. Reports side effects of tingling in her feet.  Hands tingling has resolved.     Previous pharmacotherapy for medical weight loss:  none   Bariatric surgery:  Patient has not had bariatric surgery.        PHYSICAL EXAM:  Blood pressure 122/83, pulse 73, temperature 98.2 F (36.8 C), height 5' 8 (1.727 m), weight 210 lb (95.3 kg), last menstrual period 06/14/2023, SpO2 99%. Body mass index is 31.93 kg/m.  General: She is overweight,  cooperative, alert, well developed, and in no acute distress. PSYCH: Has normal mood, affect and thought process.   Extremities: No edema.  Neurologic: No gross sensory or motor deficits. No tremors or fasciculations noted.    DIAGNOSTIC DATA REVIEWED:  BMET    Component Value Date/Time   NA 139 11/29/2022 0758   K 4.8 11/29/2022 0758   CL 103 11/29/2022 0758   CO2 22 11/29/2022 0758   GLUCOSE 96 11/29/2022 0758   BUN 12 11/29/2022 0758   CREATININE 0.76 11/29/2022 0758   CALCIUM 9.1 11/29/2022 0758   Lab Results  Component Value Date   HGBA1C 5.4 11/29/2022   HGBA1C 5.8 (H) 07/03/2022   Lab Results  Component Value Date   INSULIN  11.4 11/29/2022   INSULIN  10.3 07/03/2022   Lab Results  Component Value Date   TSH 2.57 04/04/2022   CBC    Component Value Date/Time   WBC 6.9 04/04/2022 0000   RBC 4.45 04/04/2022 0000   HGB 14.2 04/04/2022 0000   HCT 41 04/04/2022 0000   PLT 287 04/04/2022 0000   Iron Studies No results found for: IRON, TIBC, FERRITIN, IRONPCTSAT Lipid Panel     Component Value Date/Time   CHOL 208 (H) 11/29/2022 0758   TRIG 112 11/29/2022 0758   HDL 71 11/29/2022 0758   LDLCALC 117 (H) 11/29/2022 0758   Hepatic Function Panel     Component Value Date/Time  PROT 7.0 11/29/2022 0758   ALBUMIN 4.6 11/29/2022 0758   AST 16 11/29/2022 0758   ALT 16 11/29/2022 0758   ALKPHOS 65 11/29/2022 0758   BILITOT 0.3 11/29/2022 0758      Component Value Date/Time   TSH 2.57 04/04/2022 0000   Nutritional Lab Results  Component Value Date   VD25OH 62.9 11/29/2022   VD25OH 35.4 07/03/2022     ASSESSMENT AND PLAN  TREATMENT PLAN FOR OBESITY:  Recommended Dietary Goals  Lilygrace is currently in the action stage of change. As such, her goal is to continue weight management plan. She has agreed to the Category 2 Plan.  Behavioral Intervention  We discussed the following Behavioral Modification Strategies today: increasing lean  protein intake to established goals, decreasing simple carbohydrates , increasing vegetables, increasing water intake , work on meal planning and preparation, reading food labels , keeping healthy foods at home, continue to work on implementation of reduced calorie nutritional plan, continue to practice mindfulness when eating, planning for success, and continue to work on maintaining a reduced calorie state, getting the recommended amount of protein, incorporating whole foods, making healthy choices, staying well hydrated and practicing mindfulness when eating..  Additional resources provided today: NA  Recommended Physical Activity Goals  Moniqua has been advised to work up to 150 minutes of moderate intensity aerobic activity a week and strengthening exercises 2-3 times per week for cardiovascular health, weight loss maintenance and preservation of muscle mass.   She has agreed to Think about enjoyable ways to increase daily physical activity and overcoming barriers to exercise, Increase physical activity in their day and reduce sedentary time (increase NEAT)., and Work on scheduling and tracking physical activity.    Pharmacotherapy We discussed various medication options to help Oluwatoyin with her weight loss efforts and we both agreed to continue Lomaira  8mg  every day to BID and Topamax  for food impulse control and cravings.  ASSOCIATED CONDITIONS ADDRESSED TODAY  Action/Plan  Polyphagia -     Topiramate ; TAKE 1 TABLET BY MOUTH DAILY  Dispense: 90 tablet; Refill: 0 -     Lomaira ; Take one po BID  Dispense: 60 tablet; Refill: 0  Overall doing well with Lomaira  and Topamax .  Side effects discussed.   Generalized obesity -     Topiramate ; TAKE 1 TABLET BY MOUTH DAILY  Dispense: 90 tablet; Refill: 0 -     Lomaira ; Take one po BID  Dispense: 60 tablet; Refill: 0  BMI 31.0-31.9,adult      Scheduled for CPE and labs in March with PCP   Return in about 4 weeks (around 07/26/2023).SABRA She  was informed of the importance of frequent follow up visits to maximize her success with intensive lifestyle modifications for her multiple health conditions.   ATTESTASTION STATEMENTS:  Reviewed by clinician on day of visit: allergies, medications, problem list, medical history, surgical history, family history, social history, and previous encounter notes.     Corean SAUNDERS. Parmvir Boomer FNP-C

## 2023-07-12 DIAGNOSIS — F4323 Adjustment disorder with mixed anxiety and depressed mood: Secondary | ICD-10-CM | POA: Diagnosis not present

## 2023-07-20 ENCOUNTER — Other Ambulatory Visit: Payer: Self-pay | Admitting: Nurse Practitioner

## 2023-07-20 DIAGNOSIS — E669 Obesity, unspecified: Secondary | ICD-10-CM

## 2023-07-20 DIAGNOSIS — R632 Polyphagia: Secondary | ICD-10-CM

## 2023-07-25 DIAGNOSIS — F4323 Adjustment disorder with mixed anxiety and depressed mood: Secondary | ICD-10-CM | POA: Diagnosis not present

## 2023-08-02 ENCOUNTER — Ambulatory Visit: Payer: BC Managed Care – PPO | Admitting: Nurse Practitioner

## 2023-08-06 ENCOUNTER — Encounter: Payer: Self-pay | Admitting: Nurse Practitioner

## 2023-08-06 ENCOUNTER — Ambulatory Visit: Payer: BC Managed Care – PPO | Admitting: Nurse Practitioner

## 2023-08-06 VITALS — BP 122/82 | HR 78 | Temp 98.0°F | Ht 68.0 in | Wt 208.0 lb

## 2023-08-06 DIAGNOSIS — Z6831 Body mass index (BMI) 31.0-31.9, adult: Secondary | ICD-10-CM | POA: Diagnosis not present

## 2023-08-06 DIAGNOSIS — E669 Obesity, unspecified: Secondary | ICD-10-CM

## 2023-08-06 DIAGNOSIS — E66811 Obesity, class 1: Secondary | ICD-10-CM

## 2023-08-06 DIAGNOSIS — R632 Polyphagia: Secondary | ICD-10-CM

## 2023-08-06 NOTE — Progress Notes (Signed)
 Office: [phone]  /  Fax: [phone]  WEIGHT SUMMARY AND BIOMETRICS  Weight Lost Since Last Visit: 2lb  Weight Gained Since Last Visit: 0lb   Vitals Temp: 98 F (36.7 C) BP: 122/82 Pulse Rate: 78 SpO2: 99 %   Anthropometric Measurements Height: 5\' 8"  (1.727 m) Weight: 208 lb (94.3 kg) BMI (Calculated): 31.63 Weight at Last Visit: 210lb Weight Lost Since Last Visit: 2lb Weight Gained Since Last Visit: 0lb Starting Weight: 225lb Total Weight Loss (lbs): 17 lb (7.711 kg)   Body Composition  Body Fat %: 40.1 % Fat Mass (lbs): 83.8 lbs Muscle Mass (lbs): 118.6 lbs Total Body Water (lbs): 87.4 lbs Visceral Fat Rating : 9   Other Clinical Data Fasting: No Labs: No Today's Visit #: 16 Starting Date: 07/03/22     HPI  Chief Complaint: OBESITY  Kylie Dickerson is here to discuss her progress with her obesity treatment plan. She is on the the Category 2 Plan and states she is following her eating plan approximately 85 % of the time. She states she is exercising 0 minutes 0 days per week.   Interval History:  Since last office visit she has lost 2 pounds.  This is the first time she has been under 210 lbs in the past 5 years.  She is trying to pay attention to portion sizes.  She has been meal planning.  She plans to start riding her bike.    She is going to a healthy retreat in May through work May 4th-9th that is plant based.      Pharmacotherapy for weight loss: She is taking Lomaira 8mg  daily to BID for medical weight loss/polyphagia and Topamax 50mg  for food impulse control and cravings. Reports side effects of tingling in her feet.    Previous pharmacotherapy for medical weight loss:  none   Bariatric surgery:  Patient has not had bariatric surgery.       PHYSICAL EXAM:  Blood pressure 122/82, pulse 78, temperature 98 F (36.7 C), height 5\' 8"  (1.727 m), weight 208 lb (94.3 kg), last menstrual period 06/14/2023, SpO2 99%. Body mass index is 31.63  kg/m.  General: She is overweight, cooperative, alert, well developed, and in no acute distress. PSYCH: Has normal mood, affect and thought process.   Extremities: No edema.  Neurologic: No gross sensory or motor deficits. No tremors or fasciculations noted.    DIAGNOSTIC DATA REVIEWED:  BMET    Component Value Date/Time   NA 139 11/29/2022 0758   K 4.8 11/29/2022 0758   CL 103 11/29/2022 0758   CO2 22 11/29/2022 0758   GLUCOSE 96 11/29/2022 0758   BUN 12 11/29/2022 0758   CREATININE 0.76 11/29/2022 0758   CALCIUM 9.1 11/29/2022 0758   Lab Results  Component Value Date   HGBA1C 5.4 11/29/2022   HGBA1C 5.8 (H) 07/03/2022   Lab Results  Component Value Date   INSULIN 11.4 11/29/2022   INSULIN 10.3 07/03/2022   Lab Results  Component Value Date   TSH 2.57 04/04/2022   CBC    Component Value Date/Time   WBC 6.9 04/04/2022 0000   RBC 4.45 04/04/2022 0000   HGB 14.2 04/04/2022 0000   HCT 41 04/04/2022 0000   PLT 287 04/04/2022 0000   Iron Studies No results found for: "IRON", "TIBC", "FERRITIN", "IRONPCTSAT" Lipid Panel     Component Value Date/Time   CHOL 208 (H) 11/29/2022 0758   TRIG 112 11/29/2022 0758   HDL 71 11/29/2022 0758  LDLCALC 117 (H) 11/29/2022 0758   Hepatic Function Panel     Component Value Date/Time   PROT 7.0 11/29/2022 0758   ALBUMIN 4.6 11/29/2022 0758   AST 16 11/29/2022 0758   ALT 16 11/29/2022 0758   ALKPHOS 65 11/29/2022 0758   BILITOT 0.3 11/29/2022 0758      Component Value Date/Time   TSH 2.57 04/04/2022 0000   Nutritional Lab Results  Component Value Date   VD25OH 62.9 11/29/2022   VD25OH 35.4 07/03/2022     ASSESSMENT AND PLAN  TREATMENT PLAN FOR OBESITY:  Recommended Dietary Goals  Kylie Dickerson is currently in the action stage of change. As such, her goal is to continue weight management plan. She has agreed to the Category 2 Plan.  Behavioral Intervention  We discussed the following Behavioral Modification  Strategies today: increasing lean protein intake to established goals, decreasing simple carbohydrates , increasing vegetables, increasing fiber rich foods, increasing water intake , work on meal planning and preparation, reading food labels , keeping healthy foods at home, continue to work on implementation of reduced calorie nutritional plan, continue to practice mindfulness when eating, planning for success, and continue to work on maintaining a reduced calorie state, getting the recommended amount of protein, incorporating whole foods, making healthy choices, staying well hydrated and practicing mindfulness when eating..  Additional resources provided today: NA  Recommended Physical Activity Goals  Kylie Dickerson has been advised to work up to 150 minutes of moderate intensity aerobic activity a week and strengthening exercises 2-3 times per week for cardiovascular health, weight loss maintenance and preservation of muscle mass.   She has agreed to Think about enjoyable ways to increase daily physical activity and overcoming barriers to exercise, Increase physical activity in their day and reduce sedentary time (increase NEAT)., and Work on scheduling and tracking physical activity.    Pharmacotherapy We discussed various medication options to help Kylie Dickerson with her weight loss efforts and we both agreed to Kylie Dickerson 8mg  daily to BID and topamax 50mg  daily for food impulse control and cravings.  Side effects discussed.    ASSOCIATED CONDITIONS ADDRESSED TODAY  Action/Plan  Polyphagia Continue Lomaira 8mg  daily to BID  Class 1 obesity due to excess calories without serious comorbidity with body mass index (BMI) of 31.0 to 31.9 in adult  Generalized obesity     She is scheduled for labs this week and CPE next week with PCP    Return in about 4 weeks (around 09/03/2023).Marland Kitchen She was informed of the importance of frequent follow up visits to maximize her success with intensive lifestyle modifications  for her multiple health conditions.   ATTESTASTION STATEMENTS:  Reviewed by clinician on day of visit: allergies, medications, problem list, medical history, surgical history, family history, social history, and previous encounter notes.   Time spent on visit including pre-visit chart review and post-visit care and charting was 25  minutes.    Theodis Sato. Birt Reinoso FNP-C

## 2023-08-09 DIAGNOSIS — Z0000 Encounter for general adult medical examination without abnormal findings: Secondary | ICD-10-CM | POA: Diagnosis not present

## 2023-08-09 DIAGNOSIS — R7301 Impaired fasting glucose: Secondary | ICD-10-CM | POA: Diagnosis not present

## 2023-08-16 DIAGNOSIS — R82998 Other abnormal findings in urine: Secondary | ICD-10-CM | POA: Diagnosis not present

## 2023-08-16 DIAGNOSIS — Z1339 Encounter for screening examination for other mental health and behavioral disorders: Secondary | ICD-10-CM | POA: Diagnosis not present

## 2023-08-16 DIAGNOSIS — F4323 Adjustment disorder with mixed anxiety and depressed mood: Secondary | ICD-10-CM | POA: Diagnosis not present

## 2023-08-16 DIAGNOSIS — Z0000 Encounter for general adult medical examination without abnormal findings: Secondary | ICD-10-CM | POA: Diagnosis not present

## 2023-08-16 DIAGNOSIS — Z1331 Encounter for screening for depression: Secondary | ICD-10-CM | POA: Diagnosis not present

## 2023-08-16 DIAGNOSIS — G43909 Migraine, unspecified, not intractable, without status migrainosus: Secondary | ICD-10-CM | POA: Diagnosis not present

## 2023-08-31 DIAGNOSIS — F4323 Adjustment disorder with mixed anxiety and depressed mood: Secondary | ICD-10-CM | POA: Diagnosis not present

## 2023-09-03 ENCOUNTER — Encounter: Payer: Self-pay | Admitting: Nurse Practitioner

## 2023-09-03 ENCOUNTER — Ambulatory Visit: Admitting: Nurse Practitioner

## 2023-09-03 VITALS — BP 131/80 | HR 85 | Temp 98.1°F | Ht 68.0 in | Wt 204.0 lb

## 2023-09-03 DIAGNOSIS — E66811 Obesity, class 1: Secondary | ICD-10-CM | POA: Diagnosis not present

## 2023-09-03 DIAGNOSIS — Z6831 Body mass index (BMI) 31.0-31.9, adult: Secondary | ICD-10-CM

## 2023-09-03 DIAGNOSIS — R632 Polyphagia: Secondary | ICD-10-CM | POA: Diagnosis not present

## 2023-09-03 DIAGNOSIS — E669 Obesity, unspecified: Secondary | ICD-10-CM

## 2023-09-03 MED ORDER — LOMAIRA 8 MG PO TABS: ORAL_TABLET | ORAL | 0 refills | Status: DC

## 2023-09-03 MED ORDER — TOPIRAMATE 50 MG PO TABS: ORAL_TABLET | ORAL | 0 refills | Status: DC

## 2023-09-03 NOTE — Progress Notes (Signed)
 Office: [phone]  /  Fax: [phone]  WEIGHT SUMMARY AND BIOMETRICS  Weight Lost Since Last Visit: 4lb  Weight Gained Since Last Visit: 0lb   Vitals Temp: 98.1 F (36.7 C) BP: 131/80 Pulse Rate: 85 SpO2: 97 %   Anthropometric Measurements Height: 5\' 8"  (1.727 m) Weight: 204 lb (92.5 kg) BMI (Calculated): 31.03 Weight at Last Visit: 208lb Weight Lost Since Last Visit: 4lb Weight Gained Since Last Visit: 0lb Starting Weight: 225lb Total Weight Loss (lbs): 21 lb (9.526 kg)   Body Composition  Body Fat %: 39.4 % Fat Mass (lbs): 80.6 lbs Muscle Mass (lbs): 117.6 lbs Total Body Water (lbs): 87.2 lbs Visceral Fat Rating : 9   Other Clinical Data Fasting: No Labs: No Today's Visit #: 17 Starting Date: 07/03/22     HPI  Chief Complaint: OBESITY  Kylie Dickerson is here to discuss her progress with her obesity treatment plan. She is on the the Category 2 Plan and states she is following her eating plan approximately 80 % of the time. She states she is exercising 0 minutes 0 days per week.   Interval History:  Since last office visit she has lost 4 pounds.  She is overall doing well with weight loss.  She is drinking water daily.  She is struggling with exercising.  She is active at work and working in the yard.    She is going to a healthy retreat in May through work May 4th-9th that is plant based.      Goal weight:  190 lbs     Pharmacotherapy for weight loss: She is taking Lomaira 8mg  daily to BID for medical weight loss/polyphagia and Topamax 50mg  for food impulse control and cravings. Reports side effects of tingling in her hands and feet upon wakening. Gets better during the day.  Notes both Lomaira and Topamax has helped with polyphagia and cravings.       Previous pharmacotherapy for medical weight loss:  none   Bariatric surgery:  Patient has not had bariatric surgery.   PHYSICAL EXAM:  Blood pressure 131/80, pulse 85, temperature 98.1 F (36.7  C), height 5\' 8"  (1.727 m), weight 204 lb (92.5 kg), last menstrual period 08/23/2023, SpO2 97%. Body mass index is 31.02 kg/m.  General: She is overweight, cooperative, alert, well developed, and in no acute distress. PSYCH: Has normal mood, affect and thought process.   Extremities: No edema.  Neurologic: No gross sensory or motor deficits. No tremors or fasciculations noted.    DIAGNOSTIC DATA REVIEWED:  BMET    Component Value Date/Time   NA 139 11/29/2022 0758   K 4.8 11/29/2022 0758   CL 103 11/29/2022 0758   CO2 22 11/29/2022 0758   GLUCOSE 96 11/29/2022 0758   BUN 12 11/29/2022 0758   CREATININE 0.76 11/29/2022 0758   CALCIUM 9.1 11/29/2022 0758   Lab Results  Component Value Date   HGBA1C 5.4 11/29/2022   HGBA1C 5.8 (H) 07/03/2022   Lab Results  Component Value Date   INSULIN 11.4 11/29/2022   INSULIN 10.3 07/03/2022   Lab Results  Component Value Date   TSH 2.57 04/04/2022   CBC    Component Value Date/Time   WBC 6.9 04/04/2022 0000   RBC 4.45 04/04/2022 0000   HGB 14.2 04/04/2022 0000   HCT 41 04/04/2022 0000   PLT 287 04/04/2022 0000   Iron Studies No results found for: "IRON", "TIBC", "FERRITIN", "IRONPCTSAT" Lipid Panel     Component Value Date/Time  CHOL 208 (H) 11/29/2022 0758   TRIG 112 11/29/2022 0758   HDL 71 11/29/2022 0758   LDLCALC 117 (H) 11/29/2022 0758   Hepatic Function Panel     Component Value Date/Time   PROT 7.0 11/29/2022 0758   ALBUMIN 4.6 11/29/2022 0758   AST 16 11/29/2022 0758   ALT 16 11/29/2022 0758   ALKPHOS 65 11/29/2022 0758   BILITOT 0.3 11/29/2022 0758      Component Value Date/Time   TSH 2.57 04/04/2022 0000   Nutritional Lab Results  Component Value Date   VD25OH 62.9 11/29/2022   VD25OH 35.4 07/03/2022     ASSESSMENT AND PLAN  TREATMENT PLAN FOR OBESITY:  Recommended Dietary Goals  Kylie Dickerson is currently in the action stage of change. As such, her goal is to continue weight management  plan. She has agreed to the Category 2 Plan.  Behavioral Intervention  We discussed the following Behavioral Modification Strategies today: increasing lean protein intake to established goals, decreasing simple carbohydrates , increasing vegetables, increasing fiber rich foods, increasing water intake , work on meal planning and preparation, reading food labels , keeping healthy foods at home, and continue to work on maintaining a reduced calorie state, getting the recommended amount of protein, incorporating whole foods, making healthy choices, staying well hydrated and practicing mindfulness when eating..  Additional resources provided today: NA  Recommended Physical Activity Goals  Kylie Dickerson has been advised to work up to 150 minutes of moderate intensity aerobic activity a week and strengthening exercises 2-3 times per week for cardiovascular health, weight loss maintenance and preservation of muscle mass.   She has agreed to Think about enjoyable ways to increase daily physical activity and overcoming barriers to exercise, Increase physical activity in their day and reduce sedentary time (increase NEAT)., Increase the intensity, frequency or duration of strengthening exercises , and Increase the intensity, frequency or duration of aerobic exercises     Pharmacotherapy We discussed various medication options to help Kylie Dickerson with her weight loss efforts and we both agreed to continue Lomaira 8mg  BID.  Side effects discussed.  ASSOCIATED CONDITIONS ADDRESSED TODAY  Action/Plan  Polyphagia -     Lomaira; Take one po BID  Dispense: 60 tablet; Refill: 0 -     Topiramate; TAKE 1 TABLET BY MOUTH DAILY  Dispense: 90 tablet; Refill: 0  Generalized obesity -     Lomaira; Take one po BID  Dispense: 60 tablet; Refill: 0 -     Topiramate; TAKE 1 TABLET BY MOUTH DAILY  Dispense: 90 tablet; Refill: 0  Class 1 obesity due to excess calories without serious comorbidity with body mass index (BMI) of  31.0 to 31.9 in adult      She had labs on March 20th.  Will send to me via mychart.    Return in about 5 weeks (around 10/08/2023).Aaron Aas She was informed of the importance of frequent follow up visits to maximize her success with intensive lifestyle modifications for her multiple health conditions.   ATTESTASTION STATEMENTS:  Reviewed by clinician on day of visit: allergies, medications, problem list, medical history, surgical history, family history, social history, and previous encounter notes.     Crist Dominion. Necia Kamm FNP-C

## 2023-09-19 DIAGNOSIS — F4323 Adjustment disorder with mixed anxiety and depressed mood: Secondary | ICD-10-CM | POA: Diagnosis not present

## 2023-09-27 DIAGNOSIS — F411 Generalized anxiety disorder: Secondary | ICD-10-CM | POA: Diagnosis not present

## 2023-09-27 DIAGNOSIS — F331 Major depressive disorder, recurrent, moderate: Secondary | ICD-10-CM | POA: Diagnosis not present

## 2023-10-03 ENCOUNTER — Ambulatory Visit: Admitting: Nurse Practitioner

## 2023-10-03 ENCOUNTER — Encounter: Payer: Self-pay | Admitting: Nurse Practitioner

## 2023-10-03 VITALS — BP 125/84 | HR 80 | Temp 98.4°F | Ht 68.0 in | Wt 199.0 lb

## 2023-10-03 DIAGNOSIS — R632 Polyphagia: Secondary | ICD-10-CM | POA: Diagnosis not present

## 2023-10-03 DIAGNOSIS — E669 Obesity, unspecified: Secondary | ICD-10-CM

## 2023-10-03 DIAGNOSIS — Z6830 Body mass index (BMI) 30.0-30.9, adult: Secondary | ICD-10-CM | POA: Diagnosis not present

## 2023-10-03 DIAGNOSIS — F4323 Adjustment disorder with mixed anxiety and depressed mood: Secondary | ICD-10-CM | POA: Diagnosis not present

## 2023-10-03 MED ORDER — LOMAIRA 8 MG PO TABS: ORAL_TABLET | ORAL | 0 refills | Status: DC

## 2023-10-03 NOTE — Progress Notes (Signed)
 Office: [phone]  /  Fax: [phone]  WEIGHT SUMMARY AND BIOMETRICS  Weight Lost Since Last Visit: 5lb  Weight Gained Since Last Visit: 0lb   Vitals Temp: 98.4 F (36.9 C) BP: 125/84 Pulse Rate: 80 SpO2: 98 %   Anthropometric Measurements Height: 5\' 8"  (1.727 m) Weight: 199 lb (90.3 kg) BMI (Calculated): 30.26 Weight at Last Visit: 204lb Weight Lost Since Last Visit: 5lb Weight Gained Since Last Visit: 0lb Starting Weight: 225lb Total Weight Loss (lbs): 26 lb (11.8 kg)   Body Composition  Body Fat %: 36.8 % Fat Mass (lbs): 73.4 lbs Muscle Mass (lbs): 119.8 lbs Total Body Water (lbs): 81 lbs Visceral Fat Rating : 8   Other Clinical Data Fasting: No Labs: No Today's Visit #: 18 Starting Date: 07/03/22     HPI  Chief Complaint: OBESITY  Kylie Dickerson is here to discuss her progress with her obesity treatment plan. She is on the the Category 2 Plan and states she is following her eating plan approximately 95 % of the time. She states she is exercising 30 minutes 3 days per week.   Interval History:  Since last office visit she has lost 5 pounds.  She went to healthy retreat for 6 days last week-provided through her work.  It was based on a plant based diet.  She is switching her breakfast to steel oats with flax seed with fruit with almond milk, lunch 3 bean salad (kidney, black and navy beans with tart cherry juice, vinegar and spices) and dinner protein (chicken and fish) and vegetables.  She reports having more energy. She is being more active.  Drinking water, stopped caffeine intake.   She hasn't been below 200 lbs in several years   Pharmacotherapy for weight loss: She is taking Lomaira  8mg  daily to BID for medical weight loss/polyphagia and Topamax  50mg  for food impulse control and cravings. Reports side effects of tingling in her hands and feet upon wakening. Gets better during the day.  Notes both Lomaira  and Topamax  has helped with polyphagia and  cravings but has noticed more this week.     Previous pharmacotherapy for medical weight loss:  none   Bariatric surgery:  Patient has not had bariatric surgery.    PHYSICAL EXAM:  Blood pressure 125/84, pulse 80, temperature 98.4 F (36.9 C), height 5\' 8"  (1.727 m), weight 199 lb (90.3 kg), last menstrual period 09/20/2023, SpO2 98%. Body mass index is 30.26 kg/m.  General: She is overweight, cooperative, alert, well developed, and in no acute distress. PSYCH: Has normal mood, affect and thought process.   Extremities: No edema.  Neurologic: No gross sensory or motor deficits. No tremors or fasciculations noted.    DIAGNOSTIC DATA REVIEWED:  BMET    Component Value Date/Time   NA 139 11/29/2022 0758   K 4.8 11/29/2022 0758   CL 103 11/29/2022 0758   CO2 22 11/29/2022 0758   GLUCOSE 96 11/29/2022 0758   BUN 12 11/29/2022 0758   CREATININE 0.76 11/29/2022 0758   CALCIUM 9.1 11/29/2022 0758   Lab Results  Component Value Date   HGBA1C 5.4 11/29/2022   HGBA1C 5.8 (H) 07/03/2022   Lab Results  Component Value Date   INSULIN  11.4 11/29/2022   INSULIN  10.3 07/03/2022   Lab Results  Component Value Date   TSH 2.57 04/04/2022   CBC    Component Value Date/Time   WBC 6.9 04/04/2022 0000   RBC 4.45 04/04/2022 0000   HGB 14.2 04/04/2022 0000  HCT 41 04/04/2022 0000   PLT 287 04/04/2022 0000   Iron Studies No results found for: "IRON", "TIBC", "FERRITIN", "IRONPCTSAT" Lipid Panel     Component Value Date/Time   CHOL 208 (H) 11/29/2022 0758   TRIG 112 11/29/2022 0758   HDL 71 11/29/2022 0758   LDLCALC 117 (H) 11/29/2022 0758   Hepatic Function Panel     Component Value Date/Time   PROT 7.0 11/29/2022 0758   ALBUMIN 4.6 11/29/2022 0758   AST 16 11/29/2022 0758   ALT 16 11/29/2022 0758   ALKPHOS 65 11/29/2022 0758   BILITOT 0.3 11/29/2022 0758      Component Value Date/Time   TSH 2.57 04/04/2022 0000   Nutritional Lab Results  Component Value Date    VD25OH 62.9 11/29/2022   VD25OH 35.4 07/03/2022     ASSESSMENT AND PLAN  TREATMENT PLAN FOR OBESITY:  Recommended Dietary Goals  Kylie Dickerson is currently in the action stage of change. As such, her goal is to continue weight management plan. She has agreed to the Category 2 Plan.  Behavioral Intervention  We discussed the following Behavioral Modification Strategies today: increasing lean protein intake to established goals, increasing fiber rich foods, increasing water intake , work on meal planning and preparation, and continue to work on maintaining a reduced calorie state, getting the recommended amount of protein, incorporating whole foods, making healthy choices, staying well hydrated and practicing mindfulness when eating..  Additional resources provided today: NA  Recommended Physical Activity Goals  Kylie Dickerson has been advised to work up to 150 minutes of moderate intensity aerobic activity a week and strengthening exercises 2-3 times per week for cardiovascular health, weight loss maintenance and preservation of muscle mass.   She has agreed to Think about enjoyable ways to increase daily physical activity and overcoming barriers to exercise, Increase physical activity in their day and reduce sedentary time (increase NEAT)., Increase the intensity, frequency or duration of strengthening exercises , and Increase the intensity, frequency or duration of aerobic exercises     Pharmacotherapy We discussed various medication options to help Kylie Dickerson with her weight loss efforts and we both agreed to continue Lomaira  8mg  BID and Topamax  50mg .  Side effects discussed  ASSOCIATED CONDITIONS ADDRESSED TODAY  Action/Plan  Polyphagia -     Lomaira ; Take one po BID  Dispense: 60 tablet; Refill: 0  Generalized obesity -     Lomaira ; Take one po BID  Dispense: 60 tablet; Refill: 0  BMI 30.0-30.9,adult      Labs 09/24/23-will send to me via mychart   Return in about 4 weeks (around  10/31/2023).Kylie Dickerson She was informed of the importance of frequent follow up visits to maximize her success with intensive lifestyle modifications for her multiple health conditions.   ATTESTASTION STATEMENTS:  Reviewed by clinician on day of visit: allergies, medications, problem list, medical history, surgical history, family history, social history, and previous encounter notes.     Kylie Dickerson. Kylie Christenbury FNP-C

## 2023-10-25 DIAGNOSIS — F4323 Adjustment disorder with mixed anxiety and depressed mood: Secondary | ICD-10-CM | POA: Diagnosis not present

## 2023-11-01 ENCOUNTER — Ambulatory Visit: Admitting: Nurse Practitioner

## 2023-11-01 ENCOUNTER — Encounter: Payer: Self-pay | Admitting: Nurse Practitioner

## 2023-11-01 VITALS — BP 124/85 | HR 69 | Temp 97.9°F | Ht 68.0 in | Wt 198.0 lb

## 2023-11-01 DIAGNOSIS — E669 Obesity, unspecified: Secondary | ICD-10-CM

## 2023-11-01 DIAGNOSIS — Z6830 Body mass index (BMI) 30.0-30.9, adult: Secondary | ICD-10-CM

## 2023-11-01 DIAGNOSIS — R632 Polyphagia: Secondary | ICD-10-CM

## 2023-11-01 MED ORDER — TOPIRAMATE 50 MG PO TABS: ORAL_TABLET | ORAL | 0 refills | Status: DC

## 2023-11-01 NOTE — Progress Notes (Signed)
 Office: [phone]  /  Fax: [phone]  WEIGHT SUMMARY AND BIOMETRICS  Weight Lost Since Last Visit: 1lb  Weight Gained Since Last Visit: 0lb   Vitals Temp: 97.9 F (36.6 C) BP: 124/85 Pulse Rate: 69 SpO2: 100 %   Anthropometric Measurements Height: 5' 8 (1.727 m) Weight: 198 lb (89.8 kg) BMI (Calculated): 30.11 Weight at Last Visit: 199lb Weight Lost Since Last Visit: 1lb Weight Gained Since Last Visit: 0lb Starting Weight: 225lb Total Weight Loss (lbs): 27 lb (12.2 kg)   Body Composition  Body Fat %: 38 % Fat Mass (lbs): 75.4 lbs Muscle Mass (lbs): 116.8 lbs Total Body Water (lbs): 82.4 lbs Visceral Fat Rating : 8   Other Clinical Data Fasting: No Labs: No Today's Visit #: 19 Starting Date: 07/03/22     HPI  Chief Complaint: OBESITY  Kylie Dickerson is here to discuss her progress with her obesity treatment plan. She is on the the Category 2 Plan and states she is following her eating plan approximately 85 % of the time. She states she is exercising 20 minutes 2 days per week.   Interval History:  Since last office visit she has lost 1 pound.  She is trying to follow a plant based meal plan.  Her breakfast and lunch are consistent with plant based.  She still will eat chicken a couple days per week. Her protein intake consist of: tofu, chicken, eggs, beans, hummus and vegetables. She is meal planning and prepping.  She has decreased her cheese intake.   She is drinking water daily.    Pharmacotherapy for weight loss: She is taking Lomaira  8mg  daily to BID for medical weight loss/polyphagia and Topamax  50mg  for food impulse control and cravings. Reports side effects of tingling in her hands and feet upon wakening. Gets better during the day.  Notes both Lomaira  and Topamax  has continued to help with polyphagia and cravings.     Previous pharmacotherapy for medical weight loss:  none   Bariatric surgery:  Patient has not had bariatric surgery.   PHYSICAL  EXAM:  Blood pressure 124/85, pulse 69, temperature 97.9 F (36.6 C), height 5' 8 (1.727 m), weight 198 lb (89.8 kg), last menstrual period 10/13/2023, SpO2 100%. Body mass index is 30.11 kg/m.  General: She is overweight, cooperative, alert, well developed, and in no acute distress. PSYCH: Has normal mood, affect and thought process.   Extremities: No edema.  Neurologic: No gross sensory or motor deficits. No tremors or fasciculations noted.    DIAGNOSTIC DATA REVIEWED:  BMET    Component Value Date/Time   NA 139 11/29/2022 0758   K 4.8 11/29/2022 0758   CL 103 11/29/2022 0758   CO2 22 11/29/2022 0758   GLUCOSE 96 11/29/2022 0758   BUN 12 11/29/2022 0758   CREATININE 0.76 11/29/2022 0758   CALCIUM 9.1 11/29/2022 0758   Lab Results  Component Value Date   HGBA1C 5.4 11/29/2022   HGBA1C 5.8 (H) 07/03/2022   Lab Results  Component Value Date   INSULIN  11.4 11/29/2022   INSULIN  10.3 07/03/2022   Lab Results  Component Value Date   TSH 2.57 04/04/2022   CBC    Component Value Date/Time   WBC 6.9 04/04/2022 0000   RBC 4.45 04/04/2022 0000   HGB 14.2 04/04/2022 0000   HCT 41 04/04/2022 0000   PLT 287 04/04/2022 0000   Iron Studies No results found for: IRON, TIBC, FERRITIN, IRONPCTSAT Lipid Panel     Component Value Date/Time  CHOL 208 (H) 11/29/2022 0758   TRIG 112 11/29/2022 0758   HDL 71 11/29/2022 0758   LDLCALC 117 (H) 11/29/2022 0758   Hepatic Function Panel     Component Value Date/Time   PROT 7.0 11/29/2022 0758   ALBUMIN 4.6 11/29/2022 0758   AST 16 11/29/2022 0758   ALT 16 11/29/2022 0758   ALKPHOS 65 11/29/2022 0758   BILITOT 0.3 11/29/2022 0758      Component Value Date/Time   TSH 2.57 04/04/2022 0000   Nutritional Lab Results  Component Value Date   VD25OH 62.9 11/29/2022   VD25OH 35.4 07/03/2022     ASSESSMENT AND PLAN  TREATMENT PLAN FOR OBESITY:  Recommended Dietary Goals  Kylie Dickerson is currently in the action  stage of change. As such, her goal is to continue weight management plan. She has agreed to practicing portion control and making smarter food choices, such as increasing vegetables and decreasing simple carbohydrates.  Behavioral Intervention  We discussed the following Behavioral Modification Strategies today: increasing lean protein intake to established goals, decreasing simple carbohydrates , increasing water intake , work on meal planning and preparation, reading food labels , keeping healthy foods at home, continue to work on implementation of reduced calorie nutritional plan, continue to practice mindfulness when eating, and continue to work on maintaining a reduced calorie state, getting the recommended amount of protein, incorporating whole foods, making healthy choices, staying well hydrated and practicing mindfulness when eating..  Additional resources provided today: NA  Recommended Physical Activity Goals  Kylie Dickerson has been advised to work up to 150 minutes of moderate intensity aerobic activity a week and strengthening exercises 2-3 times per week for cardiovascular health, weight loss maintenance and preservation of muscle mass.   She has agreed to Think about enjoyable ways to increase daily physical activity and overcoming barriers to exercise, Increase physical activity in their day and reduce sedentary time (increase NEAT)., and continue to gradually increase the amount and intensity of exercise routine   Pharmacotherapy We discussed various medication options to help Kylie Dickerson with her weight loss efforts and we both agreed to  continue Lomaira  8mg  BID and Topamax  50mg .  Side effects discussed .  ASSOCIATED CONDITIONS ADDRESSED TODAY  Action/Plan  Polyphagia -     Topiramate ; TAKE 1 TABLET BY MOUTH DAILY  Dispense: 90 tablet; Refill: 0  Generalized obesity -     Topiramate ; TAKE 1 TABLET BY MOUTH DAILY  Dispense: 90 tablet; Refill: 0      Plans to have labs in the  next 1-2 weeks.  To send to me via mychart.    Return in about 4 weeks (around 11/29/2023).Aaron Aas She was informed of the importance of frequent follow up visits to maximize her success with intensive lifestyle modifications for her multiple health conditions.   ATTESTASTION STATEMENTS:  Reviewed by clinician on day of visit: allergies, medications, problem list, medical history, surgical history, family history, social history, and previous encounter notes.     Crist Dominion. Retal Tonkinson FNP-C

## 2023-11-14 DIAGNOSIS — F4323 Adjustment disorder with mixed anxiety and depressed mood: Secondary | ICD-10-CM | POA: Diagnosis not present

## 2023-11-22 ENCOUNTER — Encounter: Payer: Self-pay | Admitting: Nurse Practitioner

## 2023-11-22 ENCOUNTER — Ambulatory Visit: Admitting: Nurse Practitioner

## 2023-11-22 ENCOUNTER — Other Ambulatory Visit: Payer: Self-pay | Admitting: Nurse Practitioner

## 2023-11-22 VITALS — BP 129/84 | HR 94 | Temp 98.5°F | Ht 68.0 in | Wt 201.0 lb

## 2023-11-22 DIAGNOSIS — Z6830 Body mass index (BMI) 30.0-30.9, adult: Secondary | ICD-10-CM | POA: Diagnosis not present

## 2023-11-22 DIAGNOSIS — E669 Obesity, unspecified: Secondary | ICD-10-CM | POA: Diagnosis not present

## 2023-11-22 DIAGNOSIS — R632 Polyphagia: Secondary | ICD-10-CM | POA: Diagnosis not present

## 2023-11-22 MED ORDER — PHENTERMINE-TOPIRAMATE ER 7.5-46 MG PO CP24: ORAL_CAPSULE | ORAL | 0 refills | Status: DC

## 2023-11-22 MED ORDER — TOPIRAMATE 50 MG PO TABS
50.0000 mg | ORAL_TABLET | Freq: Every day | ORAL | 0 refills | Status: DC
Start: 2023-11-22 — End: 2023-12-20

## 2023-11-22 NOTE — Progress Notes (Signed)
 Office: [phone]  /  Fax: [phone]  WEIGHT SUMMARY AND BIOMETRICS  Weight Lost Since Last Visit: 0lb  Weight Gained Since Last Visit: 3lb   Vitals Temp: 98.5 F (36.9 C) BP: 129/84 Pulse Rate: 94 SpO2: 98 %   Anthropometric Measurements Height: 5' 8 (1.727 m) Weight: 201 lb (91.2 kg) BMI (Calculated): 30.57 Weight at Last Visit: 198lb Weight Lost Since Last Visit: 0lb Weight Gained Since Last Visit: 3lb Starting Weight: 225lb Total Weight Loss (lbs): 24 lb (10.9 kg)   Body Composition  Body Fat %: 38.3 % Fat Mass (lbs): 77 lbs Muscle Mass (lbs): 117.8 lbs Total Body Water (lbs): 84.2 lbs Visceral Fat Rating : 9   Other Clinical Data Fasting: no Labs: No Today's Visit #: 20 Starting Date: 07/03/22     HPI  Chief Complaint: OBESITY  Kylie Dickerson is here to discuss her progress with her obesity treatment plan. She is on the the Category 2 Plan and states she is following her eating plan approximately 75 % of the time. She states she is exercising 0 minutes 0 days per week.   Interval History:  Since last office visit she has gained 3 pounds.  She went to the beach this past weekend.  She continues to be mindful of what she is eating.  Got off track some due to vacation. Her protein intake consist of: tofu, chicken, eggs, beans, hummus and vegetables.  She is drinking water and did drink at the beach over the weekend.  She is not currently exercising but has an active job. She is planning to start a 16 week walking program.    Pharmacotherapy for weight loss: She is taking Lomaira  8mg  daily to BID for medical weight loss/polyphagia and Topamax  50mg  for food impulse control and cravings. Reports side effects of tingling in her hands and feet upon wakening-that comes and goes. Gets better during the day.  Both Lomaira  and Topamax  has continued to help with polyphagia and cravings.     Previous pharmacotherapy for medical weight loss:  none   Bariatric  surgery:  Patient has not had bariatric surgery.         PHYSICAL EXAM:  Blood pressure 129/84, pulse 94, temperature 98.5 F (36.9 C), height 5' 8 (1.727 m), weight 201 lb (91.2 kg), last menstrual period 10/13/2023, SpO2 98%. Body mass index is 30.56 kg/m.  General: She is overweight, cooperative, alert, well developed, and in no acute distress. PSYCH: Has normal mood, affect and thought process.   Extremities: No edema.  Neurologic: No gross sensory or motor deficits. No tremors or fasciculations noted.    DIAGNOSTIC DATA REVIEWED:  BMET    Component Value Date/Time   NA 139 11/29/2022 0758   K 4.8 11/29/2022 0758   CL 103 11/29/2022 0758   CO2 22 11/29/2022 0758   GLUCOSE 96 11/29/2022 0758   BUN 12 11/29/2022 0758   CREATININE 0.76 11/29/2022 0758   CALCIUM 9.1 11/29/2022 0758   Lab Results  Component Value Date   HGBA1C 5.4 11/29/2022   HGBA1C 5.8 (H) 07/03/2022   Lab Results  Component Value Date   INSULIN  11.4 11/29/2022   INSULIN  10.3 07/03/2022   Lab Results  Component Value Date   TSH 2.57 04/04/2022   CBC    Component Value Date/Time   WBC 6.9 04/04/2022 0000   RBC 4.45 04/04/2022 0000   HGB 14.2 04/04/2022 0000   HCT 41 04/04/2022 0000   PLT 287 04/04/2022 0000  Iron Studies No results found for: IRON, TIBC, FERRITIN, IRONPCTSAT Lipid Panel     Component Value Date/Time   CHOL 208 (H) 11/29/2022 0758   TRIG 112 11/29/2022 0758   HDL 71 11/29/2022 0758   LDLCALC 117 (H) 11/29/2022 0758   Hepatic Function Panel     Component Value Date/Time   PROT 7.0 11/29/2022 0758   ALBUMIN 4.6 11/29/2022 0758   AST 16 11/29/2022 0758   ALT 16 11/29/2022 0758   ALKPHOS 65 11/29/2022 0758   BILITOT 0.3 11/29/2022 0758      Component Value Date/Time   TSH 2.57 04/04/2022 0000   Nutritional Lab Results  Component Value Date   VD25OH 62.9 11/29/2022   VD25OH 35.4 07/03/2022     ASSESSMENT AND PLAN  TREATMENT PLAN FOR  OBESITY:  Recommended Dietary Goals  Kylie Dickerson is currently in the action stage of change. As such, her goal is to continue weight management plan. She has agreed to the Category 2 Plan.  Behavioral Intervention  We discussed the following Behavioral Modification Strategies today: increasing lean protein intake to established goals, decreasing simple carbohydrates , increasing vegetables, increasing fiber rich foods, increasing water intake , work on tracking and journaling calories using tracking application, reading food labels , keeping healthy foods at home, and continue to work on maintaining a reduced calorie state, getting the recommended amount of protein, incorporating whole foods, making healthy choices, staying well hydrated and practicing mindfulness when eating..  Additional resources provided today: NA  Recommended Physical Activity Goals  Kylie Dickerson has been advised to work up to 150 minutes of moderate intensity aerobic activity a week and strengthening exercises 2-3 times per week for cardiovascular health, weight loss maintenance and preservation of muscle mass.   She has agreed to Think about enjoyable ways to increase daily physical activity and overcoming barriers to exercise, Increase physical activity in their day and reduce sedentary time (increase NEAT)., and Work on scheduling and tracking physical activity.   We discussed extensively adding in resistance training 2 to 3 days a week.  Pharmacotherapy We discussed various medication options to help Kylie Dickerson with her weight loss efforts and we both agreed to stop Lomaira  and Topamax  and start generic Qsymia dose 2.  Side effects discussed.  Patient is in a same-sex relationship.  ASSOCIATED CONDITIONS ADDRESSED TODAY  Action/Plan  Polyphagia -     Phentermine -Topiramate  ER; Take one po daily  Dispense: 30 capsule; Refill: 0  Generalized obesity -     Phentermine -Topiramate  ER; Take one po daily  Dispense: 30 capsule;  Refill: 0  BMI 30.0-30.9,adult -     Phentermine -Topiramate  ER; Take one po daily  Dispense: 30 capsule; Refill: 0     Goals Meal prep Add in resistance training    Return in about 4 weeks (around 12/20/2023).Kylie Dickerson She was informed of the importance of frequent follow up visits to maximize her success with intensive lifestyle modifications for her multiple health conditions.   ATTESTASTION STATEMENTS:  Reviewed by clinician on day of visit: allergies, medications, problem list, medical history, surgical history, family history, social history, and previous encounter notes.     Kylie Dickerson SAUNDERS. Gabrianna Fassnacht FNP-C

## 2023-12-06 DIAGNOSIS — F4323 Adjustment disorder with mixed anxiety and depressed mood: Secondary | ICD-10-CM | POA: Diagnosis not present

## 2023-12-18 DIAGNOSIS — N390 Urinary tract infection, site not specified: Secondary | ICD-10-CM | POA: Diagnosis not present

## 2023-12-18 DIAGNOSIS — Z01419 Encounter for gynecological examination (general) (routine) without abnormal findings: Secondary | ICD-10-CM | POA: Diagnosis not present

## 2023-12-18 DIAGNOSIS — D219 Benign neoplasm of connective and other soft tissue, unspecified: Secondary | ICD-10-CM | POA: Diagnosis not present

## 2023-12-18 DIAGNOSIS — D259 Leiomyoma of uterus, unspecified: Secondary | ICD-10-CM | POA: Diagnosis not present

## 2023-12-18 DIAGNOSIS — Z6831 Body mass index (BMI) 31.0-31.9, adult: Secondary | ICD-10-CM | POA: Diagnosis not present

## 2023-12-18 DIAGNOSIS — Z124 Encounter for screening for malignant neoplasm of cervix: Secondary | ICD-10-CM | POA: Diagnosis not present

## 2023-12-18 DIAGNOSIS — Z1151 Encounter for screening for human papillomavirus (HPV): Secondary | ICD-10-CM | POA: Diagnosis not present

## 2023-12-20 ENCOUNTER — Encounter: Payer: Self-pay | Admitting: Nurse Practitioner

## 2023-12-20 ENCOUNTER — Ambulatory Visit: Admitting: Nurse Practitioner

## 2023-12-20 VITALS — BP 111/75 | HR 83 | Temp 98.1°F | Ht 68.0 in | Wt 204.0 lb

## 2023-12-20 DIAGNOSIS — R632 Polyphagia: Secondary | ICD-10-CM

## 2023-12-20 DIAGNOSIS — E669 Obesity, unspecified: Secondary | ICD-10-CM | POA: Diagnosis not present

## 2023-12-20 DIAGNOSIS — Z6831 Body mass index (BMI) 31.0-31.9, adult: Secondary | ICD-10-CM

## 2023-12-20 MED ORDER — TOPIRAMATE 50 MG PO TABS: 50.0000 mg | ORAL_TABLET | Freq: Every day | ORAL | 0 refills | Status: DC

## 2023-12-20 MED ORDER — LOMAIRA 8 MG PO TABS: ORAL_TABLET | ORAL | 0 refills | Status: DC

## 2023-12-20 NOTE — Progress Notes (Signed)
 Office: [phone]  /  Fax: [phone]  WEIGHT SUMMARY AND BIOMETRICS  Weight Lost Since Last Visit: 3 lb  Weight Gained Since Last Visit: 0   Vitals Temp: 98.1 F (36.7 C) BP: 111/75 Pulse Rate: 83 SpO2: 97 %   Anthropometric Measurements Height: 5' 8 (1.727 m) Weight: 204 lb (92.5 kg) BMI (Calculated): 31.03 Weight at Last Visit: 201 lb Weight Lost Since Last Visit: 3 lb Weight Gained Since Last Visit: 0 Starting Weight: 225 lb Total Weight Loss (lbs): 21 lb (9.526 kg) Peak Weight: 230 lb   Body Composition  Body Fat %: 39.4 % Fat Mass (lbs): 80.6 lbs Muscle Mass (lbs): 117.6 lbs Total Body Water (lbs): 83.6 lbs Visceral Fat Rating : 9   Other Clinical Data Fasting: no Labs: no Today's Visit #: 21 Starting Date: 07/03/22     HPI  Chief Complaint: OBESITY  Kylie Dickerson is here to discuss her progress with her obesity treatment plan. She is on the the Category 2 Plan and states she is following her eating plan approximately 90 % of the time. She states she is exercising, stretching and strength training for 10 minutes 4 days per week.   Interval History:  Since last office visit she has lost 3 pounds.  She notes more cravings around 2pm-4pm since her last visit.  She is exercising 4 days per week using [employer] app.  She is drinking water daily.   BF:  oats with oatmilk with fruit and flax seed (12 grams protein) Snack:  none Lunch:  brown rice, sweet potato, onions, kale, carrots or shrimp tacos Snack:  cookie Dinner:  tofu tacos or garlic alfredo with tofu  Pharmacotherapy for weight loss: She is taking Lomaira  8mg  daily to BID (6am, noon) for medical weight loss/polyphagia and Topamax  50mg  for food impulse control and cravings. Reports side effects of tingling in her hands and feet upon wakening-that comes and goes and gets better during the day.  Both Lomaira  and Topamax  has continued to help with polyphagia and cravings.  Denies chest pain, SHOB or  palpitations    Didn't start Qsymia  due to cost   Previous pharmacotherapy for medical weight loss:  none   Bariatric surgery:  Patient has not had bariatric surgery.   PHYSICAL EXAM:  Blood pressure 111/75, pulse 83, temperature 98.1 F (36.7 C), height 5' 8 (1.727 m), weight 204 lb (92.5 kg), SpO2 97%. Body mass index is 31.02 kg/m.  General: She is overweight, cooperative, alert, well developed, and in no acute distress. PSYCH: Has normal mood, affect and thought process.   Extremities: No edema.  Neurologic: No gross sensory or motor deficits. No tremors or fasciculations noted.    DIAGNOSTIC DATA REVIEWED:  BMET    Component Value Date/Time   NA 139 11/29/2022 0758   K 4.8 11/29/2022 0758   CL 103 11/29/2022 0758   CO2 22 11/29/2022 0758   GLUCOSE 96 11/29/2022 0758   BUN 12 11/29/2022 0758   CREATININE 0.76 11/29/2022 0758   CALCIUM 9.1 11/29/2022 0758   Lab Results  Component Value Date   HGBA1C 5.4 11/29/2022   HGBA1C 5.8 (H) 07/03/2022   Lab Results  Component Value Date   INSULIN  11.4 11/29/2022   INSULIN  10.3 07/03/2022   Lab Results  Component Value Date   TSH 2.57 04/04/2022   CBC    Component Value Date/Time   WBC 6.9 04/04/2022 0000   RBC 4.45 04/04/2022 0000   HGB 14.2 04/04/2022 0000  HCT 41 04/04/2022 0000   PLT 287 04/04/2022 0000   Iron Studies No results found for: IRON, TIBC, FERRITIN, IRONPCTSAT Lipid Panel     Component Value Date/Time   CHOL 208 (H) 11/29/2022 0758   TRIG 112 11/29/2022 0758   HDL 71 11/29/2022 0758   LDLCALC 117 (H) 11/29/2022 0758   Hepatic Function Panel     Component Value Date/Time   PROT 7.0 11/29/2022 0758   ALBUMIN 4.6 11/29/2022 0758   AST 16 11/29/2022 0758   ALT 16 11/29/2022 0758   ALKPHOS 65 11/29/2022 0758   BILITOT 0.3 11/29/2022 0758      Component Value Date/Time   TSH 2.57 04/04/2022 0000   Nutritional Lab Results  Component Value Date   VD25OH 62.9 11/29/2022    VD25OH 35.4 07/03/2022     ASSESSMENT AND PLAN  TREATMENT PLAN FOR OBESITY:  Recommended Dietary Goals  Kylie Dickerson is currently in the action stage of change. As such, her goal is to continue weight management plan. She has agreed to the Category 2 Plan.  Needs to increase protein intake.    Behavioral Intervention  We discussed the following Behavioral Modification Strategies today: increasing lean protein intake to established goals, increasing water intake , and continue to work on maintaining a reduced calorie state, getting the recommended amount of protein, incorporating whole foods, making healthy choices, staying well hydrated and practicing mindfulness when eating..  Additional resources provided today: NA  Recommended Physical Activity Goals  Kylie Dickerson has been advised to work up to 150 minutes of moderate intensity aerobic activity a week and strengthening exercises 2-3 times per week for cardiovascular health, weight loss maintenance and preservation of muscle mass.   She has agreed to Think about enjoyable ways to increase daily physical activity and overcoming barriers to exercise, Increase physical activity in their day and reduce sedentary time (increase NEAT)., and continue to gradually increase the amount and intensity of exercise routine   Pharmacotherapy We discussed various medication options to help Kylie Dickerson with her weight loss efforts and we both agreed to continue Lomaira  BID and Topamax  50mg  for food impulse control and cravings. Side effects discussed.  ASSOCIATED CONDITIONS ADDRESSED TODAY  Action/Plan  Polyphagia -     Lomaira ; Take one po BID  Dispense: 60 tablet; Refill: 0 -     Topiramate ; Take 1 tablet (50 mg total) by mouth daily.  Dispense: 30 tablet; Refill: 0  Generalized obesity -     Lomaira ; Take one po BID  Dispense: 60 tablet; Refill: 0 -     Topiramate ; Take 1 tablet (50 mg total) by mouth daily.  Dispense: 30 tablet; Refill: 0  BMI  31.0-31.9,adult -     Lomaira ; Take one po BID  Dispense: 60 tablet; Refill: 0 -     Topiramate ; Take 1 tablet (50 mg total) by mouth daily.  Dispense: 30 tablet; Refill: 0         Return in about 4 weeks (around 01/17/2024).SABRA She was informed of the importance of frequent follow up visits to maximize her success with intensive lifestyle modifications for her multiple health conditions.   ATTESTASTION STATEMENTS:  Reviewed by clinician on day of visit: allergies, medications, problem list, medical history, surgical history, family history, social history, and previous encounter notes.    Kylie Dickerson. Gibbs Naugle FNP-C

## 2023-12-24 ENCOUNTER — Other Ambulatory Visit: Payer: Self-pay

## 2023-12-24 DIAGNOSIS — Z6831 Body mass index (BMI) 31.0-31.9, adult: Secondary | ICD-10-CM

## 2023-12-24 DIAGNOSIS — R632 Polyphagia: Secondary | ICD-10-CM

## 2023-12-24 DIAGNOSIS — E669 Obesity, unspecified: Secondary | ICD-10-CM

## 2023-12-24 MED ORDER — TOPIRAMATE 50 MG PO TABS: 50.0000 mg | ORAL_TABLET | Freq: Every day | ORAL | 0 refills | Status: DC

## 2023-12-26 DIAGNOSIS — F411 Generalized anxiety disorder: Secondary | ICD-10-CM | POA: Diagnosis not present

## 2023-12-26 DIAGNOSIS — F331 Major depressive disorder, recurrent, moderate: Secondary | ICD-10-CM | POA: Diagnosis not present

## 2023-12-27 DIAGNOSIS — F4323 Adjustment disorder with mixed anxiety and depressed mood: Secondary | ICD-10-CM | POA: Diagnosis not present

## 2024-01-24 ENCOUNTER — Ambulatory Visit: Admitting: Nurse Practitioner

## 2024-01-24 ENCOUNTER — Encounter: Payer: Self-pay | Admitting: Nurse Practitioner

## 2024-01-24 VITALS — BP 120/81 | HR 73 | Temp 98.2°F | Ht 68.0 in | Wt 202.0 lb

## 2024-01-24 DIAGNOSIS — R632 Polyphagia: Secondary | ICD-10-CM

## 2024-01-24 DIAGNOSIS — Z6830 Body mass index (BMI) 30.0-30.9, adult: Secondary | ICD-10-CM

## 2024-01-24 DIAGNOSIS — E669 Obesity, unspecified: Secondary | ICD-10-CM

## 2024-01-24 DIAGNOSIS — F4323 Adjustment disorder with mixed anxiety and depressed mood: Secondary | ICD-10-CM | POA: Diagnosis not present

## 2024-01-24 MED ORDER — LOMAIRA 8 MG PO TABS: ORAL_TABLET | ORAL | 0 refills | Status: DC

## 2024-01-24 MED ORDER — TOPIRAMATE 50 MG PO TABS: 50.0000 mg | ORAL_TABLET | Freq: Every day | ORAL | 0 refills | Status: DC

## 2024-01-24 NOTE — Progress Notes (Signed)
 Office: [phone]  /  Fax: [phone]  WEIGHT SUMMARY AND BIOMETRICS  Weight Lost Since Last Visit: 2lb  Weight Gained Since Last Visit: 0lb   Vitals Temp: 98.2 F (36.8 C) BP: 120/81 Pulse Rate: 73 SpO2: 98 %   Anthropometric Measurements Height: 5' 8 (1.727 m) Weight: 202 lb (91.6 kg) BMI (Calculated): 30.72 Weight at Last Visit: 204lb Weight Lost Since Last Visit: 2lb Weight Gained Since Last Visit: 0lb Starting Weight: 225lb Total Weight Loss (lbs): 23 lb (10.4 kg)   Body Composition  Body Fat %: 39.4 % Fat Mass (lbs): 79.8 lbs Muscle Mass (lbs): 116.4 lbs Total Body Water (lbs): 83.4 lbs Visceral Fat Rating : 9   Other Clinical Data Fasting: No Labs: No Today's Visit #: 22 Starting Date: 07/03/22     HPI  Chief Complaint: OBESITY  Jayana is here to discuss her progress with her obesity treatment plan. She is on the the Category 2 Plan and states she is following her eating plan approximately 90 % of the time. She states she is exercising 60 minutes 2-3 days per week.   Interval History:  Since last office visit she has lost 2 pounds.  She has been meal prepping, watching portion sizes and making healthier choices.  She is working in the yard to stay active.  She has a very active job.  She is struggling with hip and back pain.   She has been some under some stress at work but notes that she is not stressing.  Pharmacotherapy for weight loss: She is taking Lomaira  8mg  daily to BID (6am, noon) for medical weight loss/polyphagia and Topamax  50mg  for food impulse control and cravings. Denies chest pain, SHOB or palpitations.  Didn't start Qsymia  due to cost   Previous pharmacotherapy for medical weight loss:  none   Bariatric surgery:  Patient has not had bariatric surge   PHYSICAL EXAM:  Blood pressure 120/81, pulse 73, temperature 98.2 F (36.8 C), height 5' 8 (1.727 m), weight 202 lb (91.6 kg), last menstrual period 01/19/2024, SpO2  98%. Body mass index is 30.71 kg/m.  General: She is overweight, cooperative, alert, well developed, and in no acute distress. PSYCH: Has normal mood, affect and thought process.   Extremities: No edema.  Neurologic: No gross sensory or motor deficits. No tremors or fasciculations noted.    DIAGNOSTIC DATA REVIEWED:  BMET    Component Value Date/Time   NA 139 11/29/2022 0758   K 4.8 11/29/2022 0758   CL 103 11/29/2022 0758   CO2 22 11/29/2022 0758   GLUCOSE 96 11/29/2022 0758   BUN 12 11/29/2022 0758   CREATININE 0.76 11/29/2022 0758   CALCIUM 9.1 11/29/2022 0758   Lab Results  Component Value Date   HGBA1C 5.4 11/29/2022   HGBA1C 5.8 (H) 07/03/2022   Lab Results  Component Value Date   INSULIN  11.4 11/29/2022   INSULIN  10.3 07/03/2022   Lab Results  Component Value Date   TSH 2.57 04/04/2022   CBC    Component Value Date/Time   WBC 6.9 04/04/2022 0000   RBC 4.45 04/04/2022 0000   HGB 14.2 04/04/2022 0000   HCT 41 04/04/2022 0000   PLT 287 04/04/2022 0000   Iron Studies No results found for: IRON, TIBC, FERRITIN, IRONPCTSAT Lipid Panel     Component Value Date/Time   CHOL 208 (H) 11/29/2022 0758   TRIG 112 11/29/2022 0758   HDL 71 11/29/2022 0758   LDLCALC 117 (H) 11/29/2022 0758  Hepatic Function Panel     Component Value Date/Time   PROT 7.0 11/29/2022 0758   ALBUMIN 4.6 11/29/2022 0758   AST 16 11/29/2022 0758   ALT 16 11/29/2022 0758   ALKPHOS 65 11/29/2022 0758   BILITOT 0.3 11/29/2022 0758      Component Value Date/Time   TSH 2.57 04/04/2022 0000   Nutritional Lab Results  Component Value Date   VD25OH 62.9 11/29/2022   VD25OH 35.4 07/03/2022     ASSESSMENT AND PLAN  TREATMENT PLAN FOR OBESITY:  Recommended Dietary Goals  Kimori is currently in the action stage of change. As such, her goal is to continue weight management plan. She has agreed to the Category 2 Plan.  Behavioral Intervention  We discussed the  following Behavioral Modification Strategies today: increasing lean protein intake to established goals, decreasing simple carbohydrates , increasing vegetables, increasing fiber rich foods, increasing water intake , work on meal planning and preparation, reading food labels , keeping healthy foods at home, continue to work on maintaining a reduced calorie state, getting the recommended amount of protein, incorporating whole foods, making healthy choices, staying well hydrated and practicing mindfulness when eating., and increase protein intake, fibrous foods (25 grams per day for women, 30 grams for men) and water to improve satiety and decrease hunger signals. .  Additional resources provided today: NA  Recommended Physical Activity Goals  Ameliah has been advised to work up to 150 minutes of moderate intensity aerobic activity a week and strengthening exercises 2-3 times per week for cardiovascular health, weight loss maintenance and preservation of muscle mass.   She has agreed to Think about enjoyable ways to increase daily physical activity and overcoming barriers to exercise, Increase physical activity in their day and reduce sedentary time (increase NEAT)., Continue to gradually increase the amount and intensity of exercise routine, Increase volume of physical activity to a goal of 240 minutes a week, and Combine aerobic and strengthening exercises for efficiency and improved cardiometabolic health.   Pharmacotherapy We discussed various medication options to help Samaya with her weight loss efforts and we both agreed to continue Lomaira  BID and Topamax  50mg  for food impulse control and cravings. Side effects discussed. .  ASSOCIATED CONDITIONS ADDRESSED TODAY  Action/Plan  Polyphagia -     Lomaira ; Take one po BID  Dispense: 60 tablet; Refill: 0 -     Topiramate ; Take 1 tablet (50 mg total) by mouth daily.  Dispense: 90 tablet; Refill: 0  Generalized obesity -     Lomaira ; Take one  po BID  Dispense: 60 tablet; Refill: 0 -     Topiramate ; Take 1 tablet (50 mg total) by mouth daily.  Dispense: 90 tablet; Refill: 0  BMI 30.0-30.9,adult       Patient had labs obtained in May.  Will send to me via mychart.    Return in about 4 weeks (around 02/21/2024).SABRA She was informed of the importance of frequent follow up visits to maximize her success with intensive lifestyle modifications for her multiple health conditions.   ATTESTASTION STATEMENTS:  Reviewed by clinician on day of visit: allergies, medications, problem list, medical history, surgical history, family history, social history, and previous encounter notes.    Corean SAUNDERS. Eloyce Bultman FNP-C

## 2024-01-30 DIAGNOSIS — Z1231 Encounter for screening mammogram for malignant neoplasm of breast: Secondary | ICD-10-CM | POA: Diagnosis not present

## 2024-01-30 DIAGNOSIS — D259 Leiomyoma of uterus, unspecified: Secondary | ICD-10-CM | POA: Diagnosis not present

## 2024-02-07 DIAGNOSIS — F4323 Adjustment disorder with mixed anxiety and depressed mood: Secondary | ICD-10-CM | POA: Diagnosis not present

## 2024-02-14 ENCOUNTER — Ambulatory Visit: Admitting: Nurse Practitioner

## 2024-02-14 ENCOUNTER — Encounter: Payer: Self-pay | Admitting: Nurse Practitioner

## 2024-02-14 VITALS — BP 118/78 | HR 71 | Temp 98.0°F | Ht 68.0 in | Wt 204.0 lb

## 2024-02-14 DIAGNOSIS — E669 Obesity, unspecified: Secondary | ICD-10-CM | POA: Diagnosis not present

## 2024-02-14 DIAGNOSIS — Z6831 Body mass index (BMI) 31.0-31.9, adult: Secondary | ICD-10-CM | POA: Diagnosis not present

## 2024-02-14 DIAGNOSIS — R632 Polyphagia: Secondary | ICD-10-CM | POA: Diagnosis not present

## 2024-02-14 MED ORDER — PHENTERMINE-TOPIRAMATE ER 7.5-46 MG PO CP24: ORAL_CAPSULE | ORAL | 0 refills | Status: DC

## 2024-02-14 NOTE — Patient Instructions (Signed)
 What is Qsymia and how does it work?  Qsymia is a prescription only medicine to help with your weight loss. It is a combination of two medicines that are low dose, long-acting: Phentermine & Topiramate. Qsymia contains low dose Phentermine which is a stimulant medicine that could affect your heart rate and blood pressure Qsymia is designed to help you feel satisfied faster that will help you to decrease portion size. Also, it helps curb late night snacking habits. Some food you usually enjoy may start to taste differently which will help you make healthier food choices.  This medicine will be most effective when combined with a reduced calorie diet and physical activity.  How should I take Qsymia? Take daily in the morning with breakfast. Swallow the extended-release capsule whole. Do not crush, break, or chew it.  If you miss a dose, take it as soon as possible. If it is after 12pm, skip the missed dose and go back to your regular dosing schedule. Do not take extra medicine to make up for the missed dose. You have received two separate prescriptions today. You will initially take a lower dose of 3.75mg /23mg  for 14 days then increase to a higher dose of 7.5mg /46mg  for maintenance for 30 days. There are 4 total dosing options. Your provider will discuss any need to go up to a higher dose during your office visits.  If you are taking Levothyroxine, take the Levothyroxine 1 hours before breakfast and the take the Qsymia 1 hour after breakfast. Do not stop taking Qsymia without talking to your provider. Stopping Qsymia suddenly can cause serious side effects, such as seizures and headaches.   What should I avoid while taking Qsymia? Limit caffeine to 1 small cup daily. Examples are soda, coffee, tea, herbal tea, energy drinks, and chocolate Avoid decongestant medicines like Sudafed, Mucinex-D, and Zyrtec-D. Qsymia may cause you to feel dizzy, drowsy, or confused, or to have trouble thinking  or speaking. Do not drive or do anything else that could be dangerous until you know how this medicine affects you.  Women who can become pregnant: Use effective birth control (contraception) consistently while taking Qsymia. If you miss a menstrual period, STOP Qsymia and call our office immediately. Pregnancy tests will be performed at your appointment if indicated.  What side effects may I notice when taking Qsymia? Side effects that usually do not require medical attention (report to our office if they continue or are bothersome): Dry mouth (drink at least 64 oz of fluid daily) Constipation (you may take over the counter laxative if needed) Metallic taste in your mouth when drinking carbonated beverages Numbness or tingling in the hands, arms, feet or face that lasts more than a week Headache Sudden changes in vision Mental fuzziness (problems with concentration, attention, memory or speech) Trouble sleeping (insomnia) Side effects that you should report to our office as soon as possible: Increases in heart rate and/or palpitations (feeling like your heart is racing or pounding in your chest that lasts several minutes) Chest pain Increased blood pressure Dizziness or feeling faint Shortness of breath Irritability Feeling anxious, agitated, restless, or nervous Depression or severe changes in mood Problems urinating Unusual swelling of the legs Vomiting   Other important information You will be asked to sign an informed consent prior to starting Qysmia Qsymia is a federally controlled substance. Keep Qsymia in a safe place to prevent misuse and abuse. Selling or giving away Qsymia may harm others, and is against the law.  Your prescription  will be sent to the pharmacy during your visit.  Refills will require an office visit. Your insurance may not cover the cost of this medicine. Our office will complete a pre-authorization if required by your insurance.

## 2024-02-14 NOTE — Progress Notes (Signed)
 Office: [phone]  /  Fax: [phone]  WEIGHT SUMMARY AND BIOMETRICS  Weight Lost Since Last Visit: 0lb  Weight Gained Since Last Visit: 2lb   Vitals Temp: 98 F (36.7 C) BP: 118/78 Pulse Rate: 71 SpO2: 100 %   Anthropometric Measurements Height: 5' 8 (1.727 m) Weight: 204 lb (92.5 kg) BMI (Calculated): 31.03 Weight at Last Visit: 202lb Weight Lost Since Last Visit: 0lb Weight Gained Since Last Visit: 2lb Starting Weight: 225lb Total Weight Loss (lbs): 21 lb (9.526 kg) Peak Weight: 230lb   Body Composition  Body Fat %: 39.7 % Fat Mass (lbs): 81.2 lbs Muscle Mass (lbs): 117 lbs Total Body Water (lbs): 83 lbs Visceral Fat Rating : 9   Other Clinical Data Fasting: No Labs: No Today's Visit #: 23 Starting Date: 07/03/22     HPI  Chief Complaint: OBESITY  Mikki is here to discuss her progress with her obesity treatment plan. She is on the the Category 2 Plan and states she is following her eating plan approximately 90 % of the time. She states she is exercising 60 minutes 2 days per week.   Interval History:  Since last office visit she has gained 2 lbs.  She continues to meal prep and mostly follows a plant based diet.  She is drinking water daily.   She is walking 2 days per week and plans to increase to 3-4 days per week.  She has an active job.    Pharmacotherapy for weight loss: She is taking Lomaira  8mg  daily to BID (6am, noon) for medical weight loss/polyphagia and Topamax  50mg  for food impulse control and cravings. Denies chest pain, SHOB or palpitations.  BP and pulse wnl.     Previous pharmacotherapy for medical weight loss:  none   Bariatric surgery:  Patient has not had bariatric surgery  PHYSICAL EXAM:  Blood pressure 118/78, pulse 71, temperature 98 F (36.7 C), height 5' 8 (1.727 m), weight 204 lb (92.5 kg), last menstrual period 01/19/2024, SpO2 100%. Body mass index is 31.02 kg/m.  General: She is overweight, cooperative,  alert, well developed, and in no acute distress. PSYCH: Has normal mood, affect and thought process.   Extremities: No edema.  Neurologic: No gross sensory or motor deficits. No tremors or fasciculations noted.    DIAGNOSTIC DATA REVIEWED:  BMET    Component Value Date/Time   NA 139 11/29/2022 0758   K 4.8 11/29/2022 0758   CL 103 11/29/2022 0758   CO2 22 11/29/2022 0758   GLUCOSE 96 11/29/2022 0758   BUN 12 11/29/2022 0758   CREATININE 0.76 11/29/2022 0758   CALCIUM 9.1 11/29/2022 0758   Lab Results  Component Value Date   HGBA1C 5.4 11/29/2022   HGBA1C 5.8 (H) 07/03/2022   Lab Results  Component Value Date   INSULIN  11.4 11/29/2022   INSULIN  10.3 07/03/2022   Lab Results  Component Value Date   TSH 2.57 04/04/2022   CBC    Component Value Date/Time   WBC 6.9 04/04/2022 0000   RBC 4.45 04/04/2022 0000   HGB 14.2 04/04/2022 0000   HCT 41 04/04/2022 0000   PLT 287 04/04/2022 0000   Iron Studies No results found for: IRON, TIBC, FERRITIN, IRONPCTSAT Lipid Panel     Component Value Date/Time   CHOL 208 (H) 11/29/2022 0758   TRIG 112 11/29/2022 0758   HDL 71 11/29/2022 0758   LDLCALC 117 (H) 11/29/2022 0758   Hepatic Function Panel     Component Value  Date/Time   PROT 7.0 11/29/2022 0758   ALBUMIN 4.6 11/29/2022 0758   AST 16 11/29/2022 0758   ALT 16 11/29/2022 0758   ALKPHOS 65 11/29/2022 0758   BILITOT 0.3 11/29/2022 0758      Component Value Date/Time   TSH 2.57 04/04/2022 0000   Nutritional Lab Results  Component Value Date   VD25OH 62.9 11/29/2022   VD25OH 35.4 07/03/2022     ASSESSMENT AND PLAN  TREATMENT PLAN FOR OBESITY:  Recommended Dietary Goals  Kimiye is currently in the action stage of change. As such, her goal is to continue weight management plan. She has agreed to track and will review macros at next visit.  Behavioral Intervention  We discussed the following Behavioral Modification Strategies today: increasing  lean protein intake to established goals, decreasing simple carbohydrates , increasing vegetables, increasing fiber rich foods, work on meal planning and preparation, work on tracking and journaling calories using tracking application, reading food labels , keeping healthy foods at home, planning for success, continue to work on maintaining a reduced calorie state, getting the recommended amount of protein, incorporating whole foods, making healthy choices, staying well hydrated and practicing mindfulness when eating., and increase protein intake, fibrous foods (25 grams per day for women, 30 grams for men) and water to improve satiety and decrease hunger signals. .  Additional resources provided today: NA  Recommended Physical Activity Goals  Laketta has been advised to work up to 150 minutes of moderate intensity aerobic activity a week and strengthening exercises 2-3 times per week for cardiovascular health, weight loss maintenance and preservation of muscle mass.   She has agreed to Think about enjoyable ways to increase daily physical activity and overcoming barriers to exercise, Increase physical activity in their day and reduce sedentary time (increase NEAT)., Start strengthening exercises with a goal of 2-3 sessions a week , Continue to gradually increase the amount and intensity of exercise routine, Increase volume of physical activity to a goal of 240 minutes a week, and Combine aerobic and strengthening exercises for efficiency and improved cardiometabolic health.   Pharmacotherapy We discussed various medication options to help Bhavika with her weight loss efforts and we both agreed to stop Lomaira  and Topamax  and start Qsymia  dose 2.  Side effects discussed.  ASSOCIATED CONDITIONS ADDRESSED TODAY  Action/Plan  Polyphagia -     Start -Topiramate  ER; Take one po daily  Dispense: 30 capsule; Refill: 0. Side effects discussed  Generalized obesity -     Phentermine -Topiramate  ER; Take  one po daily  Dispense: 30 capsule; Refill: 0  BMI 31.0-31.9,adult      Patient had labs obtained in May. Will send to me via mychart. If not, will obtain labs at next visit.    Return in about 4 weeks (around 03/13/2024).SABRA She was informed of the importance of frequent follow up visits to maximize her success with intensive lifestyle modifications for her multiple health conditions.   ATTESTASTION STATEMENTS:  Reviewed by clinician on day of visit: allergies, medications, problem list, medical history, surgical history, family history, social history, and previous encounter notes.      Corean SAUNDERS. Skylyn Slezak FNP-C

## 2024-02-20 DIAGNOSIS — R300 Dysuria: Secondary | ICD-10-CM | POA: Diagnosis not present

## 2024-02-21 DIAGNOSIS — F4323 Adjustment disorder with mixed anxiety and depressed mood: Secondary | ICD-10-CM | POA: Diagnosis not present

## 2024-03-12 ENCOUNTER — Ambulatory Visit: Admitting: Nurse Practitioner

## 2024-03-12 ENCOUNTER — Encounter: Payer: Self-pay | Admitting: Nurse Practitioner

## 2024-03-12 VITALS — BP 116/81 | HR 70 | Temp 97.8°F | Ht 68.0 in | Wt 205.0 lb

## 2024-03-12 DIAGNOSIS — R5383 Other fatigue: Secondary | ICD-10-CM

## 2024-03-12 DIAGNOSIS — F4323 Adjustment disorder with mixed anxiety and depressed mood: Secondary | ICD-10-CM | POA: Diagnosis not present

## 2024-03-12 DIAGNOSIS — M25551 Pain in right hip: Secondary | ICD-10-CM

## 2024-03-12 DIAGNOSIS — M25552 Pain in left hip: Secondary | ICD-10-CM

## 2024-03-12 DIAGNOSIS — M7071 Other bursitis of hip, right hip: Secondary | ICD-10-CM | POA: Diagnosis not present

## 2024-03-12 DIAGNOSIS — R632 Polyphagia: Secondary | ICD-10-CM

## 2024-03-12 DIAGNOSIS — Z6831 Body mass index (BMI) 31.0-31.9, adult: Secondary | ICD-10-CM

## 2024-03-12 DIAGNOSIS — E669 Obesity, unspecified: Secondary | ICD-10-CM | POA: Diagnosis not present

## 2024-03-12 DIAGNOSIS — M7072 Other bursitis of hip, left hip: Secondary | ICD-10-CM | POA: Diagnosis not present

## 2024-03-12 DIAGNOSIS — Z79899 Other long term (current) drug therapy: Secondary | ICD-10-CM

## 2024-03-12 MED ORDER — PHENTERMINE-TOPIRAMATE ER 11.25-69 MG PO CP24: ORAL_CAPSULE | ORAL | 0 refills | Status: DC

## 2024-03-12 NOTE — Progress Notes (Deleted)
                                                                                                              WEIGHT SUMMARY AND BIOMETRICS  No data recorded No data recorded  No data recorded No data recorded No data recorded No data recorded  OBESITY Kylie Dickerson is here to discuss her progress with her obesity treatment plan along with follow-up of her obesity related diagnoses.    Nutrition Plan: {dwwsldiets:29085} - ***% adherence.  Current exercise: {exercise types:16438}  Interim History:  *** {aabnutritionassessment:29213}   Pharmacotherapy: Kylie Dickerson is on {dwwpharmacotherapy:29109} Adverse side effects: {dwwse:29122} Hunger is {EWCONTROLASSESSMENT:24261}.  Cravings are {EWCONTROLASSESSMENT:24261}.  Assessment/Plan:   There are no diagnoses linked to this encounter.    {dwwmorbid:29108::Morbid Obesity}: Current BMI No data recorded  Pharmacotherapy Plan {dwwmed:29123}  {dwwpharmacotherapy:29109}  Kylie Dickerson {CHL AMB IS/IS NOT:210130109} currently in the action stage of change. As such, her goal is to {MWMwtloss#1:210800005}.  She has agreed to {dwwsldiets:29085}.  Exercise goals: {MWM EXERCISE RECS:23473}  Behavioral modification strategies: {dwwslwtlossstrategies:29088}.  Kylie Dickerson has agreed to follow-up with our clinic in {NUMBER 1-10:22536} weeks.   No orders of the defined types were placed in this encounter.   There are no discontinued medications.   No orders of the defined types were placed in this encounter.     Objective:   VITALS: Per patient if applicable, see vitals. GENERAL: Alert and in no acute distress. CARDIOPULMONARY: No increased WOB. Speaking in clear sentences.  PSYCH: Pleasant and cooperative. Speech normal rate and rhythm. Affect is appropriate. Insight and judgement are appropriate. Attention is focused, linear, and appropriate.  NEURO: Oriented as arrived to appointment on time with no prompting.   Attestation Statements:    This was prepared with the assistance of Engineer, civil (consulting).  Occasional wrong-word or sound-a-like substitutions may have occurred due to the inherent limitations of voice recognition

## 2024-03-12 NOTE — Progress Notes (Signed)
 Office: [phone]  /  Fax: [phone]  WEIGHT SUMMARY AND BIOMETRICS  Weight Lost Since Last Visit: 0  Weight Gained Since Last Visit: 1lb   Vitals Temp: 97.8 F (36.6 C) BP: 116/81 Pulse Rate: 70 SpO2: 100 %   Anthropometric Measurements Height: 5' 8 (1.727 m) Weight: 205 lb (93 kg) BMI (Calculated): 31.18 Weight at Last Visit: 204lb Weight Lost Since Last Visit: 0 Weight Gained Since Last Visit: 1lb Starting Weight: 225;b Total Weight Loss (lbs): 20 lb (9.072 kg) Peak Weight: 230lb   Body Composition  Body Fat %: 40.2 % Fat Mass (lbs): 82.8 lbs Muscle Mass (lbs): 116.8 lbs Total Body Water (lbs): 83.2 lbs Visceral Fat Rating : 9   Other Clinical Data Fasting: no Labs: no Today's Visit #: 24 Starting Date: 07/03/22     HPI  Chief Complaint: OBESITY  Kylie Dickerson is here to discuss her progress with her obesity treatment plan. She is on the the Category 2 Plan and states she is following her eating plan approximately 90 % of the time. She states she is exercising 120 minutes 4 days per week.   Interval History:  Since last office visit she has gained 1 pound.  She started Qsymia  and stopped lomaira  and Topamax  after her last visit.  She notes an increase in polyphagia and cravings since starting Qsymia . This past weekend she did a breast cancer walk. She walked 5 miles Friday, 5 miles Saturday and 10 miles on Sunday.  Her plan is to continue walking 2-3 miles with her weighted vest 2-3 days per week. She is struggling with hip pain (has had hip pain over several years) and has seen ortho in the past.  Had cortisone injections. The pain is getting worse and is waking her up at night  She has a hard time getting out of the car.  She is using Voltaren gel and patches that helps her to sleep during the night. She is stretching at home using the hinge app and using a tens machine with little relief. Notes occ fatigue.    Pharmacotherapy for weight loss: She is  taking Qsymia  dose 2. Denies side effects.  Denies chest pain, SHOB or palpitations.  BP and pulse wnl.     Previous pharmacotherapy for medical weight loss:  Lomaira  and Topamax -stopped after last visit when she started taking Qsymia .    Bariatric surgery:  Patient has not had bariatric surgery  PHYSICAL EXAM:  Blood pressure 116/81, pulse 70, temperature 97.8 F (36.6 C), height 5' 8 (1.727 m), weight 205 lb (93 kg), SpO2 100%. Body mass index is 31.17 kg/m.  General: She is overweight, cooperative, alert, well developed, and in no acute distress. PSYCH: Has normal mood, affect and thought process.   Extremities: No edema.  Neurologic: No gross sensory or motor deficits. No tremors or fasciculations noted.    DIAGNOSTIC DATA REVIEWED:  BMET    Component Value Date/Time   NA 139 11/29/2022 0758   K 4.8 11/29/2022 0758   CL 103 11/29/2022 0758   CO2 22 11/29/2022 0758   GLUCOSE 96 11/29/2022 0758   BUN 12 11/29/2022 0758   CREATININE 0.76 11/29/2022 0758   CALCIUM 9.1 11/29/2022 0758   Lab Results  Component Value Date   HGBA1C 5.4 11/29/2022   HGBA1C 5.8 (H) 07/03/2022   Lab Results  Component Value Date   INSULIN  11.4 11/29/2022   INSULIN  10.3 07/03/2022   Lab Results  Component Value Date   TSH 2.57  04/04/2022   CBC    Component Value Date/Time   WBC 6.9 04/04/2022 0000   RBC 4.45 04/04/2022 0000   HGB 14.2 04/04/2022 0000   HCT 41 04/04/2022 0000   PLT 287 04/04/2022 0000   Iron Studies No results found for: IRON, TIBC, FERRITIN, IRONPCTSAT Lipid Panel     Component Value Date/Time   CHOL 208 (H) 11/29/2022 0758   TRIG 112 11/29/2022 0758   HDL 71 11/29/2022 0758   LDLCALC 117 (H) 11/29/2022 0758   Hepatic Function Panel     Component Value Date/Time   PROT 7.0 11/29/2022 0758   ALBUMIN 4.6 11/29/2022 0758   AST 16 11/29/2022 0758   ALT 16 11/29/2022 0758   ALKPHOS 65 11/29/2022 0758   BILITOT 0.3 11/29/2022 0758      Component  Value Date/Time   TSH 2.57 04/04/2022 0000   Nutritional Lab Results  Component Value Date   VD25OH 62.9 11/29/2022   VD25OH 35.4 07/03/2022     ASSESSMENT AND PLAN  TREATMENT PLAN FOR OBESITY:  Recommended Dietary Goals  Kylie Dickerson is currently in the action stage of change. As such, her goal is to continue weight management plan. She has agreed to practicing portion control and making smarter food choices, such as increasing vegetables and decreasing simple carbohydrates.  Behavioral Intervention  We discussed the following Behavioral Modification Strategies today: increasing lean protein intake to established goals, decreasing simple carbohydrates , increasing vegetables, increasing fiber rich foods, increasing water intake , reading food labels , keeping healthy foods at home, planning for success, continue to work on maintaining a reduced calorie state, getting the recommended amount of protein, incorporating whole foods, making healthy choices, staying well hydrated and practicing mindfulness when eating., and increase protein intake, fibrous foods (25 grams per day for women, 30 grams for men) and water to improve satiety and decrease hunger signals. .  Additional resources provided today: NA  Recommended Physical Activity Goals She is limited on exercising due to her hip pain. Will refer back to ortho.    Pharmacotherapy We discussed various medication options to help Kylie Dickerson with her weight loss efforts and we both agreed to increase Qsymia  dose 3.  Side effects discussed.  ASSOCIATED CONDITIONS ADDRESSED TODAY  Action/Plan  Bilateral hip pain Contacted ortho and was able to get her worked in today for worsening hip pain.    Polyphagia -     Phentermine -Topiramate  ER; Take one po daily  Dispense: 30 capsule; Refill: 0  Medication management -     CBC with Differential/Platelet -     Comprehensive metabolic panel with GFR -     TSH  Other fatigue -     CBC with  Differential/Platelet -     Comprehensive metabolic panel with GFR -     TSH  Generalized obesity -     Phentermine -Topiramate  ER; Take one po daily  Dispense: 30 capsule; Refill: 0 -     TSH  BMI 31.0-31.9,adult -     Phentermine -Topiramate  ER; Take one po daily  Dispense: 30 capsule; Refill: 0         Return in about 4 weeks (around 04/09/2024).Kylie Dickerson She was informed of the importance of frequent follow up visits to maximize her success with intensive lifestyle modifications for her multiple health conditions.   ATTESTASTION STATEMENTS:  Reviewed by clinician on day of visit: allergies, medications, problem list, medical history, surgical history, family history, social history, and previous encounter notes.  Kylie Dickerson SAUNDERS. Lynnda Wiersma FNP-C

## 2024-03-26 DIAGNOSIS — F4323 Adjustment disorder with mixed anxiety and depressed mood: Secondary | ICD-10-CM | POA: Diagnosis not present

## 2024-03-26 DIAGNOSIS — F411 Generalized anxiety disorder: Secondary | ICD-10-CM | POA: Diagnosis not present

## 2024-03-26 DIAGNOSIS — F331 Major depressive disorder, recurrent, moderate: Secondary | ICD-10-CM | POA: Diagnosis not present

## 2024-04-09 ENCOUNTER — Ambulatory Visit: Admitting: Nurse Practitioner

## 2024-04-10 DIAGNOSIS — F4323 Adjustment disorder with mixed anxiety and depressed mood: Secondary | ICD-10-CM | POA: Diagnosis not present

## 2024-04-17 ENCOUNTER — Other Ambulatory Visit: Payer: Self-pay | Admitting: Nurse Practitioner

## 2024-04-17 DIAGNOSIS — E669 Obesity, unspecified: Secondary | ICD-10-CM

## 2024-04-17 DIAGNOSIS — R632 Polyphagia: Secondary | ICD-10-CM

## 2024-04-17 DIAGNOSIS — Z6831 Body mass index (BMI) 31.0-31.9, adult: Secondary | ICD-10-CM

## 2024-04-24 ENCOUNTER — Ambulatory Visit: Admitting: Nurse Practitioner

## 2024-04-24 ENCOUNTER — Encounter: Payer: Self-pay | Admitting: Nurse Practitioner

## 2024-04-24 VITALS — BP 112/78 | HR 86 | Temp 98.0°F | Ht 68.0 in | Wt 208.0 lb

## 2024-04-24 DIAGNOSIS — E66811 Obesity, class 1: Secondary | ICD-10-CM | POA: Diagnosis not present

## 2024-04-24 DIAGNOSIS — E669 Obesity, unspecified: Secondary | ICD-10-CM | POA: Diagnosis not present

## 2024-04-24 DIAGNOSIS — Z6831 Body mass index (BMI) 31.0-31.9, adult: Secondary | ICD-10-CM

## 2024-04-24 DIAGNOSIS — F4323 Adjustment disorder with mixed anxiety and depressed mood: Secondary | ICD-10-CM | POA: Diagnosis not present

## 2024-04-24 DIAGNOSIS — M25552 Pain in left hip: Secondary | ICD-10-CM

## 2024-04-24 DIAGNOSIS — Z79899 Other long term (current) drug therapy: Secondary | ICD-10-CM | POA: Diagnosis not present

## 2024-04-24 DIAGNOSIS — M25551 Pain in right hip: Secondary | ICD-10-CM | POA: Diagnosis not present

## 2024-04-24 DIAGNOSIS — R5383 Other fatigue: Secondary | ICD-10-CM | POA: Diagnosis not present

## 2024-04-24 MED ORDER — PHENTERMINE-TOPIRAMATE ER 15-92 MG PO CP24: ORAL_CAPSULE | ORAL | 0 refills | Status: DC

## 2024-04-24 NOTE — Progress Notes (Signed)
 Office: [phone]  /  Fax: [phone]  WEIGHT SUMMARY AND BIOMETRICS  Weight Lost Since Last Visit: 0lb  Weight Gained Since Last Visit: 3lb   Vitals Temp: 98 F (36.7 C) BP: 112/78 Pulse Rate: 86 SpO2: 96 %   Anthropometric Measurements Height: 5' 8 (1.727 m) Weight: 208 lb (94.3 kg) BMI (Calculated): 31.63 Weight at Last Visit: 205lb Weight Lost Since Last Visit: 0lb Weight Gained Since Last Visit: 3lb Starting Weight: 225lb Total Weight Loss (lbs): 17 lb (7.711 kg)   Body Composition  Body Fat %: 40.5 % Fat Mass (lbs): 84.4 lbs Muscle Mass (lbs): 117.8 lbs Total Body Water (lbs): 83.8 lbs Visceral Fat Rating : 9   Other Clinical Data Fasting: No Labs: Yes Today's Visit #: 25 Starting Date: 07/03/22     HPI  Chief Complaint: OBESITY  Kylie Dickerson is here to discuss her progress with her obesity treatment plan. She is on the the Category 2 Plan and states she is following her eating plan approximately 85 % of the time. She states she is exercising 0 minutes 0 days per week.   Interval History:  Since last office visit she has gained 3 pounds.  She has gotten off track due to the holidays.  She hasn't been meal prepping and hasn't been following a meal plan.  She is drinking water daily and has started drinking eggnog since her last visit. She is not currently exercising due to her job and hip pain.    Pharmacotherapy for weight loss: She is taking Qsymia  dose 3. Denies side effects.  Denies chest pain, SHOB or palpitations.  BP and pulse wnl.  Has helped with cravings.     Previous pharmacotherapy for medical weight loss:  Lomaira  and Topamax -stopped when she started taking Qsymia .    Bariatric surgery:  Patient has not had bariatric surgery  B hip pain She saw ortho and had cortisone injections.  Was started on Celebrex-has helped but notes she continues to struggle with pain.       PHYSICAL EXAM:  Blood pressure 112/78, pulse 86, temperature  98 F (36.7 C), height 5' 8 (1.727 m), weight 208 lb (94.3 kg), last menstrual period 04/09/2024, SpO2 96%. Body mass index is 31.63 kg/m.  General: She is overweight, cooperative, alert, well developed, and in no acute distress. PSYCH: Has normal mood, affect and thought process.   Extremities: No edema.  Neurologic: No gross sensory or motor deficits. No tremors or fasciculations noted.    DIAGNOSTIC DATA REVIEWED:  BMET    Component Value Date/Time   NA 139 11/29/2022 0758   K 4.8 11/29/2022 0758   CL 103 11/29/2022 0758   CO2 22 11/29/2022 0758   GLUCOSE 96 11/29/2022 0758   BUN 12 11/29/2022 0758   CREATININE 0.76 11/29/2022 0758   CALCIUM 9.1 11/29/2022 0758   Lab Results  Component Value Date   HGBA1C 5.4 11/29/2022   HGBA1C 5.8 (H) 07/03/2022   Lab Results  Component Value Date   INSULIN  11.4 11/29/2022   INSULIN  10.3 07/03/2022   Lab Results  Component Value Date   TSH 2.57 04/04/2022   CBC    Component Value Date/Time   WBC 6.9 04/04/2022 0000   RBC 4.45 04/04/2022 0000   HGB 14.2 04/04/2022 0000   HCT 41 04/04/2022 0000   PLT 287 04/04/2022 0000   Iron Studies No results found for: IRON, TIBC, FERRITIN, IRONPCTSAT Lipid Panel     Component Value Date/Time   CHOL  208 (H) 11/29/2022 0758   TRIG 112 11/29/2022 0758   HDL 71 11/29/2022 0758   LDLCALC 117 (H) 11/29/2022 0758   Hepatic Function Panel     Component Value Date/Time   PROT 7.0 11/29/2022 0758   ALBUMIN 4.6 11/29/2022 0758   AST 16 11/29/2022 0758   ALT 16 11/29/2022 0758   ALKPHOS 65 11/29/2022 0758   BILITOT 0.3 11/29/2022 0758      Component Value Date/Time   TSH 2.57 04/04/2022 0000   Nutritional Lab Results  Component Value Date   VD25OH 62.9 11/29/2022   VD25OH 35.4 07/03/2022     ASSESSMENT AND PLAN  TREATMENT PLAN FOR OBESITY:  Recommended Dietary Goals  Kylie Dickerson is currently in the action stage of change. As such, her goal is to continue weight  management plan. She has agreed to practicing portion control and making smarter food choices, such as increasing vegetables and decreasing simple carbohydrates.  Behavioral Intervention  We discussed the following Behavioral Modification Strategies today: increasing lean protein intake to established goals, decreasing simple carbohydrates , increasing vegetables, increasing fiber rich foods, increasing water intake , work on meal planning and preparation, reading food labels , keeping healthy foods at home, planning for success, better snacking choices, celebration eating strategies, continue to work on maintaining a reduced calorie state, getting the recommended amount of protein, incorporating whole foods, making healthy choices, staying well hydrated and practicing mindfulness when eating., and increase protein intake, fibrous foods (25 grams per day for women, 30 grams for men) and water to improve satiety and decrease hunger signals. .  Additional resources provided today: NA  Recommended Physical Activity Goals  Kylie Dickerson has been advised to work up to 150 minutes of moderate intensity aerobic activity a week and strengthening exercises 2-3 times per week for cardiovascular health, weight loss maintenance and preservation of muscle mass.   She has agreed to Think about enjoyable ways to increase daily physical activity and overcoming barriers to exercise, Increase physical activity in their day and reduce sedentary time (increase NEAT)., Work on scheduling and tracking physical activity. , Continue to gradually increase the amount and intensity of exercise routine, Increase volume of physical activity to a goal of 240 minutes a week, and Combine aerobic and strengthening exercises for efficiency and improved cardiometabolic health.  She is limited on exercising due to hip pain   Pharmacotherapy We discussed various medication options to help Kylie Dickerson with her weight loss efforts and we both  agreed to increase Qsymia  dose 4.  Side effects discussed.  ASSOCIATED CONDITIONS ADDRESSED TODAY  Action/Plan  Bilateral hip pain Recommended making a follow up appt with ortho.  Will need to wait until January due to cost  Obesity, Class I, BMI 30-34.9 -     Phentermine -Topiramate  ER; Take one po daily  Dispense: 30 capsule; Refill: 0      Labs obtained today   Return in about 4 weeks (around 05/22/2024).Kylie Dickerson She was informed of the importance of frequent follow up visits to maximize her success with intensive lifestyle modifications for her multiple health conditions.   ATTESTASTION STATEMENTS:  Reviewed by clinician on day of visit: allergies, medications, problem list, medical history, surgical history, family history, social history, and previous encounter notes.     Kylie Dickerson SAUNDERS. Kaelin Holford FNP-C

## 2024-04-25 LAB — COMPREHENSIVE METABOLIC PANEL WITH GFR
ALT: 24 IU/L (ref 0–32)
AST: 19 IU/L (ref 0–40)
Albumin: 4.6 g/dL (ref 3.9–4.9)
Alkaline Phosphatase: 72 IU/L (ref 41–116)
BUN/Creatinine Ratio: 19 (ref 9–23)
BUN: 15 mg/dL (ref 6–24)
Bilirubin Total: 0.3 mg/dL (ref 0.0–1.2)
CO2: 23 mmol/L (ref 20–29)
Calcium: 9.9 mg/dL (ref 8.7–10.2)
Chloride: 106 mmol/L (ref 96–106)
Creatinine, Ser: 0.77 mg/dL (ref 0.57–1.00)
Globulin, Total: 2.1 g/dL (ref 1.5–4.5)
Glucose: 76 mg/dL (ref 70–99)
Potassium: 5.2 mmol/L (ref 3.5–5.2)
Sodium: 140 mmol/L (ref 134–144)
Total Protein: 6.7 g/dL (ref 6.0–8.5)
eGFR: 95 mL/min/1.73 (ref >59)

## 2024-04-25 LAB — CBC WITH DIFFERENTIAL/PLATELET
Basophils Absolute: 0 x10E3/uL (ref 0.0–0.2)
Basos: 1 %
EOS (ABSOLUTE): 0.1 x10E3/uL (ref 0.0–0.4)
Eos: 2 %
Hematocrit: 44.9 % (ref 34.0–46.6)
Hemoglobin: 14.6 g/dL (ref 11.1–15.9)
Immature Grans (Abs): 0 x10E3/uL (ref 0.0–0.1)
Immature Granulocytes: 0 %
Lymphocytes Absolute: 1.2 x10E3/uL (ref 0.7–3.1)
Lymphs: 28 %
MCH: 31.6 pg (ref 26.6–33.0)
MCHC: 32.5 g/dL (ref 31.5–35.7)
MCV: 97 fL (ref 79–97)
Monocytes Absolute: 0.4 x10E3/uL (ref 0.1–0.9)
Monocytes: 9 %
Neutrophils Absolute: 2.5 x10E3/uL (ref 1.4–7.0)
Neutrophils: 60 %
Platelets: 325 x10E3/uL (ref 150–450)
RBC: 4.62 x10E6/uL (ref 3.77–5.28)
RDW: 13.1 % (ref 11.7–15.4)
WBC: 4.1 x10E3/uL (ref 3.4–10.8)

## 2024-04-25 LAB — TSH: TSH: 0.925 u[IU]/mL (ref 0.450–4.500)

## 2024-05-26 ENCOUNTER — Ambulatory Visit: Admitting: Nurse Practitioner

## 2024-06-11 ENCOUNTER — Ambulatory Visit: Admitting: Nurse Practitioner

## 2024-06-11 ENCOUNTER — Encounter: Payer: Self-pay | Admitting: Nurse Practitioner

## 2024-06-11 VITALS — BP 129/84 | HR 70 | Temp 98.2°F | Ht 68.0 in | Wt 211.0 lb

## 2024-06-11 DIAGNOSIS — E66811 Obesity, class 1: Secondary | ICD-10-CM

## 2024-06-11 DIAGNOSIS — Z6832 Body mass index (BMI) 32.0-32.9, adult: Secondary | ICD-10-CM

## 2024-06-11 DIAGNOSIS — R632 Polyphagia: Secondary | ICD-10-CM

## 2024-06-11 MED ORDER — PHENTERMINE-TOPIRAMATE ER 7.5-46 MG PO CP24: ORAL_CAPSULE | ORAL | 0 refills | Status: AC

## 2024-06-11 NOTE — Progress Notes (Signed)
 "  Office: [phone]  /  Fax: [phone]  WEIGHT SUMMARY AND BIOMETRICS  Weight Lost Since Last Visit: 0lb  Weight Gained Since Last Visit: 3lb   Vitals Temp: 98.2 F (36.8 C) BP: 129/84 Pulse Rate: 70 SpO2: 98 %   Anthropometric Measurements Height: 5' 8 (1.727 m) Weight: 211 lb (95.7 kg) BMI (Calculated): 32.09 Weight at Last Visit: 208lb Weight Lost Since Last Visit: 0lb Weight Gained Since Last Visit: 3lb Starting Weight: 225lb Total Weight Loss (lbs): 14 lb (6.35 kg)   Body Composition  Body Fat %: 41.1 % Fat Mass (lbs): 86.8 lbs Muscle Mass (lbs): 118 lbs Total Body Water (lbs): 85.6 lbs Visceral Fat Rating : 10   Other Clinical Data Fasting: No Labs: No Today's Visit #: 26 Starting Date: 07/03/22     HPI  Chief Complaint: OBESITY  Kylie Dickerson is here to discuss her progress with her obesity treatment plan. She is on the the Category 2 Plan and states she is following her eating plan approximately 80 % of the time. She states she is exercising 0 minutes 0 days per week.   Interval History:  Since last office visit she has gained 3 pounds.  She celebrated Christmas and recently had the flu.  She notes she splurged some over the holidays.  She feels like she is getting back on track.  She is drinking water daily.  She has an active job.  She is not currently exercising.     Pharmacotherapy for weight loss: She is not currently taking Qsymia .  She took her last dose 2 weeks ago.  She notes since being off Qsymia  she notes more polyphagia.     Previous pharmacotherapy for medical weight loss:  Lomaira  and Topamax -stopped when she started taking Qsymia .    Bariatric surgery:  Patient has not had bariatric surgery  PHYSICAL EXAM:  Blood pressure 129/84, pulse 70, temperature 98.2 F (36.8 C), height 5' 8 (1.727 m), weight 211 lb (95.7 kg), last menstrual period 05/27/2024, SpO2 98%. Body mass index is 32.08 kg/m.  General: She is overweight,  cooperative, alert, well developed, and in no acute distress. PSYCH: Has normal mood, affect and thought process.   Extremities: No edema.  Neurologic: No gross sensory or motor deficits. No tremors or fasciculations noted.    DIAGNOSTIC DATA REVIEWED:  BMET    Component Value Date/Time   NA 140 04/24/2024 1031   K 5.2 04/24/2024 1031   CL 106 04/24/2024 1031   CO2 23 04/24/2024 1031   GLUCOSE 76 04/24/2024 1031   BUN 15 04/24/2024 1031   CREATININE 0.77 04/24/2024 1031   CALCIUM 9.9 04/24/2024 1031   Lab Results  Component Value Date   HGBA1C 5.4 11/29/2022   HGBA1C 5.8 (H) 07/03/2022   Lab Results  Component Value Date   INSULIN  11.4 11/29/2022   INSULIN  10.3 07/03/2022   Lab Results  Component Value Date   TSH 0.925 04/24/2024   CBC    Component Value Date/Time   WBC 4.1 04/24/2024 1031   RBC 4.62 04/24/2024 1031   RBC 4.45 04/04/2022 0000   HGB 14.6 04/24/2024 1031   HCT 44.9 04/24/2024 1031   PLT 325 04/24/2024 1031   MCV 97 04/24/2024 1031   MCH 31.6 04/24/2024 1031   MCHC 32.5 04/24/2024 1031   RDW 13.1 04/24/2024 1031   Iron Studies No results found for: IRON, TIBC, FERRITIN, IRONPCTSAT Lipid Panel     Component Value Date/Time   CHOL 208 (H)  11/29/2022 0758   TRIG 112 11/29/2022 0758   HDL 71 11/29/2022 0758   LDLCALC 117 (H) 11/29/2022 0758   Hepatic Function Panel     Component Value Date/Time   PROT 6.7 04/24/2024 1031   ALBUMIN 4.6 04/24/2024 1031   AST 19 04/24/2024 1031   ALT 24 04/24/2024 1031   ALKPHOS 72 04/24/2024 1031   BILITOT 0.3 04/24/2024 1031      Component Value Date/Time   TSH 0.925 04/24/2024 1031   Nutritional Lab Results  Component Value Date   VD25OH 62.9 11/29/2022   VD25OH 35.4 07/03/2022     ASSESSMENT AND PLAN  TREATMENT PLAN FOR OBESITY:  Recommended Dietary Goals  Alyia is currently in the action stage of change. As such, her goal is to continue weight management plan. She has agreed  to practicing portion control and making smarter food choices, such as increasing vegetables and decreasing simple carbohydrates.  Behavioral Intervention  We discussed the following Behavioral Modification Strategies today: increasing lean protein intake to established goals, decreasing simple carbohydrates , increasing vegetables, increasing fiber rich foods, increasing water intake , work on meal planning and preparation, reading food labels , keeping healthy foods at home, planning for success, continue to work on maintaining a reduced calorie state, getting the recommended amount of protein, incorporating whole foods, making healthy choices, staying well hydrated and practicing mindfulness when eating., and increase protein intake, fibrous foods (25 grams per day for women, 30 grams for men) and water to improve satiety and decrease hunger signals. .  Additional resources provided today: NA  Recommended Physical Activity Goals  Magdelene has been advised to work up to 150 minutes of moderate intensity aerobic activity a week and strengthening exercises 2-3 times per week for cardiovascular health, weight loss maintenance and preservation of muscle mass.   She has agreed to Think about enjoyable ways to increase daily physical activity and overcoming barriers to exercise, Increase physical activity in their day and reduce sedentary time (increase NEAT)., Work on scheduling and tracking physical activity. , Continue to gradually increase the amount and intensity of exercise routine, and Combine aerobic and strengthening exercises for efficiency and improved cardiometabolic health.   Pharmacotherapy We discussed various medication options to help Rendi with her weight loss efforts and we both agreed to restart Qsymia  dose 2.  Side effects discussed.  Patient is in a same sex relationship.    ASSOCIATED CONDITIONS ADDRESSED TODAY  Action/Plan  Polyphagia -     Phentermine -Topiramate  ER; Take  one po daily  Dispense: 30 capsule; Refill: 0  Obesity, Class I, BMI 30-34.9 -     Phentermine -Topiramate  ER; Take one po daily  Dispense: 30 capsule; Refill: 0      Labs reviewed in chart with patient from 04/24/24   Return in about 4 weeks (around 07/09/2024).SABRA She was informed of the importance of frequent follow up visits to maximize her success with intensive lifestyle modifications for her multiple health conditions.   ATTESTASTION STATEMENTS:  Reviewed by clinician on day of visit: allergies, medications, problem list, medical history, surgical history, family history, social history, and previous encounter notes.    Corean SAUNDERS. Krisna Omar FNP-C "

## 2024-07-09 ENCOUNTER — Ambulatory Visit: Admitting: Nurse Practitioner
